# Patient Record
Sex: Male | Born: 1975 | Race: White | Hispanic: No | Marital: Married | Smoking: Never smoker
Health system: Southern US, Community
[De-identification: ages and names within clinical notes are randomized; demographics above are authoritative.]

## PROBLEM LIST (undated history)

## (undated) DIAGNOSIS — R5381 Other malaise: Secondary | ICD-10-CM

## (undated) DIAGNOSIS — R079 Chest pain, unspecified: Secondary | ICD-10-CM

## (undated) DIAGNOSIS — R5383 Other fatigue: Secondary | ICD-10-CM

## (undated) DIAGNOSIS — J189 Pneumonia, unspecified organism: Secondary | ICD-10-CM

## (undated) DIAGNOSIS — Z8249 Family history of ischemic heart disease and other diseases of the circulatory system: Secondary | ICD-10-CM

## (undated) HISTORY — DX: Other fatigue: R53.83

## (undated) HISTORY — DX: Pneumonia, unspecified organism: J18.9

## (undated) HISTORY — DX: Other malaise: R53.81

## (undated) HISTORY — DX: Chest pain, unspecified: R07.9

## (undated) HISTORY — DX: Family history of ischemic heart disease and other diseases of the circulatory system: Z82.49

---

## 2005-04-01 ENCOUNTER — Ambulatory Visit: Payer: Self-pay | Admitting: Family Medicine

## 2005-04-09 ENCOUNTER — Ambulatory Visit: Payer: Self-pay | Admitting: Family Medicine

## 2005-04-13 ENCOUNTER — Ambulatory Visit: Payer: Self-pay | Admitting: Gastroenterology

## 2005-04-17 ENCOUNTER — Ambulatory Visit: Payer: Self-pay | Admitting: Gastroenterology

## 2005-05-26 ENCOUNTER — Ambulatory Visit: Payer: Self-pay | Admitting: Gastroenterology

## 2005-07-10 LAB — HM MAMMOGRAPHY: HM Mammogram: NORMAL

## 2005-07-31 ENCOUNTER — Ambulatory Visit: Payer: Self-pay | Admitting: Family Medicine

## 2006-01-20 ENCOUNTER — Ambulatory Visit: Payer: Self-pay | Admitting: Family Medicine

## 2006-12-21 ENCOUNTER — Ambulatory Visit: Payer: Self-pay | Admitting: Family Medicine

## 2007-04-13 ENCOUNTER — Ambulatory Visit: Payer: Self-pay | Admitting: Family Medicine

## 2007-05-05 ENCOUNTER — Ambulatory Visit: Payer: Self-pay | Admitting: Family Medicine

## 2007-05-10 ENCOUNTER — Encounter: Admission: RE | Admit: 2007-05-10 | Discharge: 2007-05-10 | Payer: Self-pay | Admitting: Family Medicine

## 2007-05-13 ENCOUNTER — Telehealth (INDEPENDENT_AMBULATORY_CARE_PROVIDER_SITE_OTHER): Payer: Self-pay | Admitting: Internal Medicine

## 2007-05-17 ENCOUNTER — Ambulatory Visit: Payer: Self-pay | Admitting: Family Medicine

## 2007-06-06 ENCOUNTER — Telehealth (INDEPENDENT_AMBULATORY_CARE_PROVIDER_SITE_OTHER): Payer: Self-pay | Admitting: Internal Medicine

## 2008-03-01 ENCOUNTER — Ambulatory Visit: Payer: Self-pay | Admitting: Family Medicine

## 2008-09-04 ENCOUNTER — Encounter (INDEPENDENT_AMBULATORY_CARE_PROVIDER_SITE_OTHER): Payer: Self-pay | Admitting: Internal Medicine

## 2008-09-13 ENCOUNTER — Encounter (INDEPENDENT_AMBULATORY_CARE_PROVIDER_SITE_OTHER): Payer: Self-pay | Admitting: Internal Medicine

## 2008-11-09 ENCOUNTER — Ambulatory Visit: Payer: Self-pay | Admitting: Family Medicine

## 2008-11-09 ENCOUNTER — Encounter: Admission: RE | Admit: 2008-11-09 | Discharge: 2008-11-09 | Payer: Self-pay | Admitting: Family Medicine

## 2008-11-20 ENCOUNTER — Ambulatory Visit: Payer: Self-pay | Admitting: Family Medicine

## 2008-11-21 LAB — CONVERTED CEMR LAB
Basophils Absolute: 0 10*3/uL (ref 0.0–0.1)
Basophils Relative: 0.6 % (ref 0.0–3.0)
Lymphocytes Relative: 41.4 % (ref 12.0–46.0)
MCHC: 35.1 g/dL (ref 30.0–36.0)
MCV: 89.1 fL (ref 78.0–100.0)
Monocytes Absolute: 0.4 10*3/uL (ref 0.1–1.0)
Neutrophils Relative %: 47.5 % (ref 43.0–77.0)
Platelets: 268 10*3/uL (ref 150.0–400.0)
RBC: 4.55 M/uL (ref 4.22–5.81)
RDW: 12.1 % (ref 11.5–14.6)

## 2009-02-27 ENCOUNTER — Ambulatory Visit: Payer: Self-pay | Admitting: Family Medicine

## 2009-02-27 DIAGNOSIS — R079 Chest pain, unspecified: Secondary | ICD-10-CM | POA: Insufficient documentation

## 2009-03-12 ENCOUNTER — Encounter (INDEPENDENT_AMBULATORY_CARE_PROVIDER_SITE_OTHER): Payer: Self-pay | Admitting: *Deleted

## 2009-03-14 ENCOUNTER — Ambulatory Visit: Payer: Self-pay | Admitting: Cardiovascular Disease

## 2009-03-18 ENCOUNTER — Telehealth: Payer: Self-pay | Admitting: Family Medicine

## 2009-03-20 ENCOUNTER — Ambulatory Visit: Payer: Self-pay | Admitting: Cardiovascular Disease

## 2009-03-20 ENCOUNTER — Ambulatory Visit (HOSPITAL_COMMUNITY): Admission: RE | Admit: 2009-03-20 | Discharge: 2009-03-20 | Payer: Self-pay | Admitting: Cardiovascular Disease

## 2009-03-27 ENCOUNTER — Ambulatory Visit: Payer: Self-pay | Admitting: Family Medicine

## 2009-03-27 DIAGNOSIS — F411 Generalized anxiety disorder: Secondary | ICD-10-CM | POA: Insufficient documentation

## 2009-03-27 DIAGNOSIS — K219 Gastro-esophageal reflux disease without esophagitis: Secondary | ICD-10-CM | POA: Insufficient documentation

## 2009-04-17 ENCOUNTER — Ambulatory Visit: Payer: Self-pay | Admitting: Family Medicine

## 2009-07-30 ENCOUNTER — Telehealth: Payer: Self-pay | Admitting: Family Medicine

## 2010-03-09 LAB — CONVERTED CEMR LAB
AST: 25 units/L (ref 0–37)
Albumin: 4.7 g/dL (ref 3.5–5.2)
Bilirubin, Direct: 0.1 mg/dL (ref 0.0–0.3)
CO2: 30 meq/L (ref 19–32)
Calcium: 9.4 mg/dL (ref 8.4–10.5)
Chloride: 106 meq/L (ref 96–112)
Cholesterol: 159 mg/dL (ref 0–200)
Eosinophils Absolute: 0 10*3/uL (ref 0.0–0.7)
GFR calc non Af Amer: 102.29 mL/min (ref >60)
Glucose, Bld: 95 mg/dL (ref 70–99)
HCT: 43.3 % (ref 39.0–52.0)
HDL: 28.7 mg/dL — ABNORMAL LOW (ref >39.00)
Hemoglobin: 14.6 g/dL (ref 13.0–17.0)
Lymphs Abs: 1.5 10*3/uL (ref 0.7–4.0)
MCV: 90.8 fL (ref 78.0–100.0)
Monocytes Absolute: 0.3 10*3/uL (ref 0.1–1.0)
Monocytes Relative: 7.1 % (ref 3.0–12.0)
Neutrophils Relative %: 56.7 % (ref 43.0–77.0)
TSH: 2.53 microintl units/mL (ref 0.35–5.50)
Total Bilirubin: 1.7 mg/dL — ABNORMAL HIGH (ref 0.3–1.2)
Total CHOL/HDL Ratio: 6
Triglycerides: 111 mg/dL (ref 0.0–149.0)
VLDL: 22.2 mg/dL (ref 0.0–40.0)
WBC: 4.2 10*3/uL — ABNORMAL LOW (ref 4.5–10.5)

## 2010-03-11 NOTE — Progress Notes (Signed)
Summary: pt requests phone call  Phone Note Call from Patient Call back at Work Phone [phone] Call back at   cell [phone].   Caller: Patient Call For: Judith Part MD Summary of Call: Pt will be going for a cardiac CT scan on wednesday.  He has some questions and concerns regarding this and he is asking that you call him tomorrow to discuss.  He is considering cancelling this exam. Initial call taken by: Lowella Petties CMA,  March 18, 2009 4:53 PM  Follow-up for Phone Call        he had questions about the amount of radiation that would be involved in cardiac CT and risks vs benefits  I adv him to call the cardiac office to better answer that question  Follow-up by: Judith Part MD,  March 19, 2009 2:01 PM

## 2010-03-11 NOTE — Assessment & Plan Note (Signed)
Summary: HEARTBURN.. CYD   Vital Signs:  Patient profile:   35 year old male Height:      69.25 inches Weight:      182.75 pounds BMI:     26.89 Temp:     97.8 degrees F oral Pulse rate:   80 / minute Pulse rhythm:   regular BP sitting:   124 / 84  (left arm) Cuff size:   large  Vitals Entered By: Lewanda Rife LPN (March 27, 2009 12:29 PM)  History of Present Illness: had a cardiac CT - to assess risks  was really worried about it  day of the test trouble getting IV acesss - that was scary    after the test was over had a flushing rxn - and some hives -- per haps all rxn  given px for prednisone -- did not need took some benadryl   now is having a burning sensation - esp in bed at night  very hollow weird feeling -- wondered if it was anxiety -- almost in tears with it  worried that the test "damaged him" still nagging heartburn on friday  very strange feeling  called the radiology dept -- ? still having rxn -- she assured him that it was not  by sunday - his anx started calming down   monday started some mid back pain   took some gaviscon at work - and that helped a bit  went home that night  got worse lately -- and took some apple cider vinigar - that made his stomach hurt a bit  took  2 tablespoons of honey- that helped  ate it all day yesteday - and it did help  symptoms are coming and going     Allergies (verified): No Known Drug Allergies  Past History:  Past Medical History: Last updated: 03/13/2009 Current Problems:  CORONARY ARTERY DISEASE, FAMILY HX (ICD-V17.3) CHEST PAIN (ICD-786.50) FATIGUE (ICD-780.79) HEALTH MAINTENANCE EXAM (ICD-V70.0) pneumonia  Past Surgical History: Last updated: 04/28/2007 colonoscopy 3/07 hemorrhoids  Family History: Last updated: 03/21/2009 Father: died at 33 of esophageal ca--smoker, cardiomyopathy, ruptured cerebral aneurism Mother:  Siblings:  PGF - heart problems -- died at 98 Paunt died at 41  heart Paunt died at 23 with heart  GM with diabetes  brothers times 2 with HTN in 66s   DM- MI- CVA-  Prostate Cancer- Breast Cancer- Ovarian Cancer- Uterine Cancer- Colon Cancer- Drug/ ETOH Abuse- Depression-   Social History: Last updated: Mar 21, 2009 non smoker- never  regular exercise  occasional alcohol   Review of Systems General:  Denies fatigue, fever, loss of appetite, and malaise. Eyes:  Denies blurring and discharge. ENT:  Denies sore throat. CV:  Denies chest pain or discomfort, leg cramps with exertion, lightheadness, near fainting, palpitations, and shortness of breath with exertion. Resp:  Denies cough, pleuritic, shortness of breath, and wheezing. GI:  Complains of gas and indigestion; denies abdominal pain, dark tarry stools, diarrhea, nausea, and vomiting. Derm:  Denies itching, lesion(s), poor wound healing, and rash. Neuro:  Denies numbness and tingling. Psych:  Complains of anxiety; denies panic attacks, sense of great danger, and suicidal thoughts/plans. Endo:  Denies cold intolerance, excessive thirst, excessive urination, and heat intolerance. Heme:  Denies abnormal bruising and bleeding.  Physical Exam  General:  Well-developed,well-nourished,in no acute distress; alert,appropriate and cooperative throughout examination Head:  normocephalic, atraumatic, and no abnormalities observed.   Eyes:  vision grossly intact, pupils equal, pupils round, and pupils reactive to light.  no conjunctival  pallor, injection or icterus  Ears:  R ear normal and L ear normal.   Mouth:  pharynx pink and moist.   Neck:  supple with full rom and no masses or thyromegally, no JVD or carotid bruit  Chest Wall:  No deformities, masses, tenderness or gynecomastia noted. Lungs:  Normal respiratory effort, chest expands symmetrically. Lungs are clear to auscultation, no crackles or wheezes. Heart:  Normal rate and regular rhythm. S1 and S2 normal without gallop, murmur, click,  rub or other extra sounds. Abdomen:  mld epigastric tenderness without rebound or gaurding  soft, normal bowel sounds, no distention, no masses, no hepatomegaly, and no splenomegaly.   Msk:  No deformity or scoliosis noted of thoracic or lumbar spine.   no CVA tenderness  Pulses:  R and L carotid,radial,femoral,dorsalis pedis and posterior tibial pulses are full and equal bilaterally Extremities:  No clubbing, cyanosis, edema, or deformity noted with normal full range of motion of all joints.   Neurologic:  sensation intact to light touch, gait normal, and DTRs symmetrical and normal.   Skin:  Intact without suspicious lesions or rashes Cervical Nodes:  No lymphadenopathy noted Inguinal Nodes:  No significant adenopathy Psych:  somewhat anxious with rapid speech- but pleasant with good eye contact and communication skills   Impression & Recommendations:  Problem # 1:  HEARTBURN (ICD-787.1) Assessment New worse with anxiety revolving around recent cardiac testing  some imp with gaviscon and honey trial of prilosec 20 /also bland diet and gradual dec in caffiene  if not significant imp in 10 days -- will get abd [place]  f/u 1 mo for r e check and lab (hx of baseline high bilirubin as well  Problem # 2:  CORONARY ARTERY DISEASE, FAMILY HX (ICD-V17.3) Assessment: Unchanged re assured with recent nl cardiac CT- reviewed this today  Problem # 3:  ANXIETY STATE, UNSPECIFIED (ICD-300.00) Assessment: New triggered by recent stressful event and cardiac testing  suspec this worsened gastritis and reflux  is now calmed down- but can consider counseling if not imp had good support and coping skills   Complete Medication List: 1)  Multivitamins Tabs (Multiple vitamin) .... Take 1 tablet by mouth once a day 2)  Vitamin C 500 Mg Tabs (Ascorbic acid) .... Take one by mouth daily 3)  Flonase 50 Mcg/act Susp (Fluticasone propionate) .... 2 sprays in each nostril once daily 4)  Fish Oil 1000 Mg Caps  (Omega-3 fatty acids) .... Take 1 capsule by mouth two times a day 5)  Prilosec 20 Mg Cpdr (Omeprazole) .Marland Kitchen.. 1 by mouth once daily in am  Patient Instructions: 1)  gradually cut caffiene by one serving per week ( or 1/3 per week) - consider a low acid brand or decaff  2)  avoid spicy food  3)  try to eat regular meals 4)  start omeprazole one pill daily about 30 minutes before breakfast  5)  if your symptoms are not starting to improve in 10 days (or if worse) please call and I will set up ultrasound  6)  follow up with me in 1 month  Prescriptions: PRILOSEC 20 MG CPDR (OMEPRAZOLE) 1 by mouth once daily in am  #30 x 5   Entered and Authorized by:   Judith Part MD   Signed by:   Judith Part MD on 03/27/2009   Method used:   Electronically to        [id number]  [address]       [place], [place]  [address]       Ph: [phone]       Fax: [phone]   RxID:   [id number]   Current Allergies (reviewed today): No known allergies

## 2010-03-11 NOTE — Progress Notes (Signed)
Summary: prior Berkley Harvey is needed for omeprazole  Phone Note Call from Patient Call back at [phone]   Caller: Patient Call For: Judith Part MD Summary of Call: Prior auth is needed for omeprazole.  I spoke with the  patient and he has not tried any other reflux meds. Pharmacist said pt can get prilosec otc, 20 mg's.  A months supply will cost him $5.00.  Ok to change to that? Initial call taken by: Lowella Petties CMA,  July 30, 2009 2:28 PM  Follow-up for Phone Call        Fine. Follow-up by: Shaune Leeks MD,  July 30, 2009 2:41 PM  Additional Follow-up for Phone Call Additional follow up Details #1::        Changed in emr, called to pharmacy. Advised pt. Additional Follow-up by: Lowella Petties CMA,  July 31, 2009 11:54 AM    New/Updated Medications: PRILOSEC OTC 20 MG TBEC (OMEPRAZOLE MAGNESIUM) take one by mouth every morning Prescriptions: PRILOSEC OTC 20 MG TBEC (OMEPRAZOLE MAGNESIUM) take one by mouth every morning  #30 x 5   Entered by:   Lowella Petties CMA   Authorized by:   Shaune Leeks MD   Signed by:   Lowella Petties CMA on 07/30/2009   Method used:   Telephoned to ...       [facility]  [address] [id number]       [address]       [place], [place]  [address]       Ph: [phone]       Fax: [phone]   RxID:   [id number]   Prior Medications: MULTIVITAMINS   TABS (MULTIPLE VITAMIN) Take 1 tablet by mouth once a day VITAMIN C 500 MG TABS (ASCORBIC ACID) Take one by mouth daily FISH OIL 1000 MG CAPS (OMEGA-3 FATTY ACIDS) Take 1 capsule by mouth two times a day Current Allergies: No known allergies

## 2010-03-11 NOTE — Assessment & Plan Note (Signed)
Summary: np6/CAD/family hx of CAD   Visit Type:  Initial Consult  CC:  Chest discomfort- Left shoulder pain.  History of Present Illness: Darryl Jenkins is seen today at the request of Dr Milinda Antis.  He has a markedly positive family history for CAD  with everyone except his mother in the last 3 generations having premature CAD or DCM.  He has had 2 recentl episodes of SSCP radiating to the left shoulder.  the first one lasted about an hour and and subsided spontaneously.  He couldnt find a comfortalbe position  The second episode was more central and lasted shorter time period.  He has had occasional SOB wtih exertion whe he plays softball.  I believe his LDL choesterol is ok but he has not had a lipomed profile.  Given his symptoms and markedly positive family history he was referred for evaluation.  He takes fish oil but not ASA  Current Problems (verified): 1)  Coronary Artery Disease, Family Hx  (ICD-V17.3) 2)  Chest Pain  (ICD-786.50) 3)  Fatigue  (ICD-780.79) 4)  Health Maintenance Exam  (ICD-V70.0)  Current Medications (verified): 1)  Multivitamins   Tabs (Multiple Vitamin) .... Take 1 Tablet By Mouth Once A Day 2)  Vitamin C 500 Mg Tabs (Ascorbic Acid) .... Take One By Mouth Daily 3)  Flonase 50 Mcg/act Susp (Fluticasone Propionate) .... 2 Sprays in Each Nostril Once Daily 4)  Fish Oil 1000 Mg Caps (Omega-3 Fatty Acids) .... Take 1 Capsule By Mouth Two Times A Day  Allergies (verified): No Known Drug Allergies  Past History:  Past Medical History: Last updated: 03/13/2009 Current Problems:  CORONARY ARTERY DISEASE, FAMILY HX (ICD-V17.3) CHEST PAIN (ICD-786.50) FATIGUE (ICD-780.79) HEALTH MAINTENANCE EXAM (ICD-V70.0) pneumonia  Past Surgical History: Last updated: 04/28/2007 colonoscopy 3/07 hemorrhoids  Family History: Last updated: 2009/03/10 Father: died at 65 of esophageal ca--smoker, cardiomyopathy, ruptured cerebral aneurism Mother:  Siblings:  PGF - heart problems --  died at 40 Paunt died at 20 heart Paunt died at 10 with heart  GM with diabetes  brothers times 2 with HTN in 60s   DM- MI- CVA-  Prostate Cancer- Breast Cancer- Ovarian Cancer- Uterine Cancer- Colon Cancer- Drug/ ETOH Abuse- Depression-   Social History: Last updated: 03/10/09 non smoker- never  regular exercise  occasional alcohol   Review of Systems       Denies fever, malais, weight loss, blurry vision, decreased visual acuity, cough, sputum,  hemoptysis, pleuritic pain, palpitaitons, heartburn, abdominal pain, melena, lower extremity edema, claudication, or rash. All other systmptom reviewed and negative  Vital Signs:  Patient profile:   35 year old male Height:      69.25 inches Weight:      187.50 pounds BMI:     27.59 Pulse rate:   82 / minute Resp:     18 per minute BP sitting:   136 / 80  (left arm) Cuff size:   large  Vitals Entered By: Vikki Ports (March 14, 2009 3:42 PM)  Physical Exam  General:  Affect appropriate Healthy:  appears stated age HEENT: normal Neck supple with no adenopathy JVP normal no bruits no thyromegaly Lungs clear with no wheezing and good diaphragmatic motion Heart:  S1/S2 no murmur,rub, gallop or click PMI normal Abdomen: benighn, BS positve, no tenderness, no AAA no bruit.  No HSM or HJR Distal pulses intact with no bruits No edema Neuro non-focal Skin warm and dry    Impression & Recommendations:  Problem # 1:  CHEST  PAIN (ICD-786.50) The best single test for this paitent that would have both short and long term prognostic value, R/O congenital anomalies, and HOCM, assess coronary arteries and any beginnings of CAD would be a cardiac CT with calcium score.  We will see if his insurance will pay for this.  If they don't we would be left with a ETT treadmill and Echo.  This would not address his 5-10 year risk given positive family history.  I would also recommend a lipomed profile to further stratify his  particle size and need for risk factor modification Orders: EKG w/ Interpretation (93000)  Problem # 2:  DYSPNEA (ICD-786.05) Normal cardiopulmonary exam.  CT would assess RV/LV function and R/O HOCM as well as evaluated remainder of chest Alternatively echo to be done  Other Orders: Cardiac CTA (Cardiac CTA)  Patient Instructions: 1)  Your physician recommends that you schedule a follow-up appointment as needed 2)  Your physician has requested that you have a cardiac CT.  Cardiac computed tomography (CT) is a painless test that uses an x-ray machine to take clear, detailed pictures of your heart.  For further information please visit [email or web address]  Please follow instruction sheet as given.   EKG Report  Procedure date:  03/14/2009  Findings:      Sinus arrythmia Rate 82 otherwise normal ECG

## 2010-03-11 NOTE — Assessment & Plan Note (Signed)
Summary: CPX/CLE   Vital Signs:  Patient profile:   35 year old male Height:      69.25 inches Weight:      186.25 pounds BMI:     27.40 Temp:     98 degrees F oral Pulse rate:   64 / minute Pulse rhythm:   regular BP sitting:   124 / 82  (left arm) Cuff size:   large  Vitals Entered By: Lewanda Rife LPN (April 17, 1608 10:39 AM)  History of Present Illness: here for wellness exam and chronic med problems   wt is up 4 lb with bmi of 27-- is eating better   bp good at 124/82  recent nl cardiac w/u  due for Td 2000 due for ptx 04 -- has had pneumonia in past   labs in jan - bili 1.7 LDL 108 and HDL low in 20s -- is taking 1000 mg of fish oil tried 2 at a time for 2 weeks -- then two times a day --- then got out of habit  exercise - plays rec softball spring and fall  issues with time   gerd is better -- with omeprazole   no prostate problems / does have occ hemorroid that itches     Allergies (verified): No Known Drug Allergies  Past History:  Past Medical History: Last updated: 03/13/2009 Current Problems:  CORONARY ARTERY DISEASE, FAMILY HX (ICD-V17.3) CHEST PAIN (ICD-786.50) FATIGUE (ICD-780.79) HEALTH MAINTENANCE EXAM (ICD-V70.0) pneumonia  Past Surgical History: Last updated: 04/28/2007 colonoscopy 3/07 hemorrhoids  Family History: Last updated: 25-Mar-2009 Father: died at 36 of esophageal ca--smoker, cardiomyopathy, ruptured cerebral aneurism Mother:  Siblings:  PGF - heart problems -- died at 98 Paunt died at 36 heart Paunt died at 63 with heart  GM with diabetes  brothers times 2 with HTN in 67s   DM- MI- CVA-  Prostate Cancer- Breast Cancer- Ovarian Cancer- Uterine Cancer- Colon Cancer- Drug/ ETOH Abuse- Depression-   Social History: Last updated: Mar 25, 2009 non smoker- never  regular exercise  occasional alcohol   Review of Systems General:  Denies fatigue, fever, loss of appetite, and malaise. Eyes:  Denies blurring and  eye irritation. ENT:  Denies sore throat. CV:  Denies chest pain or discomfort, palpitations, and shortness of breath with exertion. Resp:  Denies cough and wheezing. GI:  Denies abdominal pain, bloody stools, change in bowel habits, and indigestion. GU:  Denies nocturia, urinary frequency, and urinary hesitancy. MS:  Denies joint pain, joint redness, joint swelling, and muscle aches. Derm:  Denies poor wound healing and rash. Neuro:  Denies headaches, numbness, and tingling. Psych:  Denies anxiety and depression. Endo:  Denies excessive thirst and excessive urination. Heme:  Denies abnormal bruising, bleeding, and enlarge lymph nodes.   Physical Exam  General:  Well-developed,well-nourished,in no acute distress; alert,appropriate and cooperative throughout examination Head:  normocephalic, atraumatic, and no abnormalities observed.   Eyes:  vision grossly intact, pupils equal, pupils round, and pupils reactive to light.  no conjunctival pallor, injection or icterus  Ears:  R ear normal and L ear normal.   Nose:  no nasal discharge.   Mouth:  pharynx pink and moist.   Neck:  supple with full rom and no masses or thyromegally, no JVD or carotid bruit  Chest Wall:  No deformities, masses, tenderness or gynecomastia noted. Lungs:  Normal respiratory effort, chest expands symmetrically. Lungs are clear to auscultation, no crackles or wheezes. Heart:  Normal rate and regular rhythm. S1 and  S2 normal without gallop, murmur, click, rub or other extra sounds. Abdomen:  Bowel sounds positive,abdomen soft and non-tender without masses, organomegaly or hernias noted. no renal bruits  Msk:  No deformity or scoliosis noted of thoracic or lumbar spine.  no acute joint changes  Pulses:  R and L carotid,radial,femoral,dorsalis pedis and posterior tibial pulses are full and equal bilaterally Extremities:  No clubbing, cyanosis, edema, or deformity noted with normal full range of motion of all joints.     Neurologic:  sensation intact to light touch, gait normal, and DTRs symmetrical and normal.   Skin:  Intact without suspicious lesions or rashes comedone on low back few lentigos Cervical Nodes:  No lymphadenopathy noted Inguinal Nodes:  No significant adenopathy Psych:  normal affect, talkative and pleasant - is not anxious today   Impression & Recommendations:  Problem # 1:  HEALTH MAINTENANCE EXAM (ICD-V70.0) Assessment Comment Only reviewed health habits including diet, exercise and skin cancer prevention reviewed health maintenance list and family history overall quite healthy disc getting back to exercise reviewed lab today suspect elevated bili may be baselne/ gilberts-- will continue to monitor pt given copy of labs   Problem # 2:  HEARTBURN (ICD-787.1) Assessment: Improved much imp with omeprazole  will continue this 1 more mo and exp imp with less stress update if symptoms return  Problem # 3:  CHEST PAIN (ICD-786.50) Assessment: Improved this is resolved rev neg cardiac work up  adv to update me if symptoms return  Complete Medication List: 1)  Multivitamins Tabs (Multiple vitamin) .... Take 1 tablet by mouth once a day 2)  Vitamin C 500 Mg Tabs (Ascorbic acid) .... Take one by mouth daily 3)  Fish Oil 1000 Mg Caps (Omega-3 fatty acids) .... Take 1 capsule by mouth two times a day 4)  Prilosec 20 Mg Cpdr (Omeprazole) .Marland Kitchen.. 1 by mouth once daily in am  Other Orders: TD Toxoids IM 7 YR + (56213) Pneumococcal Vaccine (08657) Admin 1st Vaccine (84696) Admin of Any Addtl Vaccine (29528) Admin 1st Vaccine (State) [phone]) Admin of Any Addtl Vaccine (State) [phone])  Patient Instructions: 1)  continue the omeprazole daily for 1 month and then try to stop it  2)  if symptoms return- re start it and let me know  3)  try to go up to 3 fish oil caps per day - if you do not tolerate it , drop back  4)  tetnus shot and pneumonia vaccine  5)  we will watch the  bilirubin level - I suspect this is hereditary -- if you get right sided abdominal pain please update me   Current Allergies (reviewed today): No known allergies      Tetanus/Td Vaccine    Vaccine Type: Td    Site: left deltoid    Mfr: Sanofi Pasteur    Dose: 0.5 ml    Route: IM    Given by: Lewanda Rife LPN    Exp. Date: 12/25/2010    Lot #: [id number]    VIS given: 12/28/06 version given April 17, 2009.  Pneumovax Vaccine    Vaccine Type: Pneumovax    Site: right deltoid    Mfr: Merck    Dose: 0.5 ml    Route: IM    Given by: Lewanda Rife LPN    Exp. Date: 09/23/2010    Lot #: [id number]    VIS given: 09/07/95 version given April 17, 2009.

## 2010-03-11 NOTE — Assessment & Plan Note (Signed)
Summary: SHOULDER PAIN,CHECK BP/CLE   Vital Signs:  Patient profile:   35 year old male Weight:      188 pounds Temp:     97.9 degrees F oral Pulse rate:   68 / minute Pulse rhythm:   regular BP sitting:   132 / 90  (left arm) Cuff size:   regular  Vitals Entered By: Lowella Petties CMA (February 27, 2009 12:23 PM)  Serial Vital Signs/Assessments:  Time      Position  BP       Pulse  Resp  Temp     By                     120/82                         Judith Part MD  CC: Check BP, discuss concerns   History of Present Illness: he is concerned about his heart health has a fam hx of heart problems  over past 6 mo has noticed some changes  wt gain in past 4-5 mo -- highest wt ever (no change in eating or exercise habits)  decided to start running with his new dog  after 1/4 mile gets discomfort in chest- right in center / very hard to describe (stops after he stops running )  also a little more sob than he thinks he should be no nausea or diaphoresis  monday - had some shoulder pain L in afternoon- desc as burning pain  / so he put heating pad on it / dozed off and was fine until that eve when he was resting again (it did hurt to move it )  yesterday was just fine   friend of his recently had MI- and this concerns him   plays rec softball and eats very healthy   some stress- school and job and 2 kids --  a lot o handle     Allergies: No Known Drug Allergies  Family History: Father: died at 71 of esophageal ca--smoker, cardiomyopathy, ruptured cerebral aneurism Mother:  Siblings:  PGF - heart problems -- died at 75 Paunt died at 67 heart Paunt died at 41 with heart  GM with diabetes  brothers times 2 with HTN in 46s   DM- MI- CVA-  Prostate Cancer- Breast Cancer- Ovarian Cancer- Uterine Cancer- Colon Cancer- Drug/ ETOH Abuse- Depression-   Social History: non smoker- never  regular exercise  occasional alcohol   Review of Systems General:   Complains of fatigue; denies chills, fever, loss of appetite, and malaise. Eyes:  Denies blurring and eye pain. CV:  Complains of chest pain or discomfort, shortness of breath with exertion, and weight gain; denies difficulty breathing at night, difficulty breathing while lying down, fainting, lightheadness, near fainting, palpitations, and swelling of feet. Resp:  Denies cough, pleuritic, sputum productive, and wheezing. GI:  Denies abdominal pain, change in bowel habits, indigestion, nausea, and vomiting. GU:  Denies urinary frequency. MS:  Denies joint redness, joint swelling, cramps, and muscle weakness. Derm:  Denies itching, lesion(s), poor wound healing, and rash. Neuro:  Denies numbness, tingling, and weakness. Psych:  mood is good . Endo:  Denies excessive thirst and excessive urination. Heme:  Denies abnormal bruising and bleeding.  Physical Exam  General:  Well-developed,well-nourished,in no acute distress; alert,appropriate and cooperative throughout examination Head:  normocephalic, atraumatic, and no abnormalities observed.   Eyes:  vision grossly intact, pupils equal, pupils round,  and pupils reactive to light.  no conjunctival pallor, injection or icterus  Mouth:  pharynx pink and moist.   Neck:  supple with full rom and no masses or thyromegally, no JVD or carotid bruit  no cervical tenderness  Chest Wall:  No deformities, masses, tenderness or gynecomastia noted. Lungs:  Normal respiratory effort, chest expands symmetrically. Lungs are clear to auscultation, no crackles or wheezes. Heart:  Normal rate and regular rhythm. S1 and S2 normal without gallop, murmur, click, rub or other extra sounds. Abdomen:  Bowel sounds positive,abdomen soft and non-tender without masses, organomegaly or hernias noted. no renal bruits  Msk:  L shoulder - no tenderness/swelling or deformity nl rom  neg hawking/neer tests  nl scratch test  no chest wall tenderness  Pulses:  R and L  carotid,radial,femoral,dorsalis pedis and posterior tibial pulses are full and equal bilaterally Extremities:  No clubbing, cyanosis, edema, or deformity noted with normal full range of motion of all joints.   Neurologic:  strength normal in all extremities and sensation intact to light touch.   Skin:  Intact without suspicious lesions or rashes Cervical Nodes:  No lymphadenopathy noted Psych:  normal affect, talkative and pleasant    Impression & Recommendations:  Problem # 1:  CHEST PAIN (ICD-786.50) Assessment New healthy pt (fam hx heart dz) with new exertional chest and shoulder discomfort nl ekg and labs pending  in light of strong fam hx (and desire to be more active) will ref to cardiol to disc risk factors and poss stress testing  adv if symptoms return or worsen - to seek care immed (pt voiced understanding )  Orders: EKG w/ Interpretation (93000) Venipuncture (52841) TLB-Lipid Panel (80061-LIPID) TLB-BMP (Basic Metabolic Panel-BMET) (80048-METABOL) TLB-CBC Platelet - w/Differential (85025-CBCD) TLB-Hepatic/Liver Function Pnl (80076-HEPATIC) TLB-TSH (Thyroid Stimulating Hormone) (84443-TSH) Cardiology Referral (Cardiology)  Problem # 2:  CORONARY ARTERY DISEASE, FAMILY HX (ICD-V17.3) Assessment: Comment Only see above  disc risk factors  card ref  lab today- todisc at f/u incl chol Orders: EKG w/ Interpretation (93000) Cardiology Referral (Cardiology)  Problem # 3:  FATIGUE (ICD-780.79) Assessment: New labs today may be from schedule stress to some extent  will disc in more detail at f/u Orders: Venipuncture (32440) TLB-Lipid Panel (80061-LIPID) TLB-BMP (Basic Metabolic Panel-BMET) (80048-METABOL) TLB-CBC Platelet - w/Differential (85025-CBCD) TLB-Hepatic/Liver Function Pnl (80076-HEPATIC) TLB-TSH (Thyroid Stimulating Hormone) (84443-TSH)  Complete Medication List: 1)  Multivitamins Tabs (Multiple vitamin) .... Take 1 tablet by mouth once a day 2)  Vitamin  C 500 Mg Tabs (Ascorbic acid) .... Take one by mouth daily 3)  Flonase 50 Mcg/act Susp (Fluticasone propionate) .... 2 sprays in each nostril once daily 4)  Proventil Hfa 108 (90 Base) Mcg/act Aers (Albuterol sulfate) .... 2 puffs up to every 4 hours as needed wheezing or chest tightness  Patient Instructions: 1)  labs today including cholesterol 2)  we will schedule cardiology consult at check out  3)  try to keep up healthy low fat and low salt diet    Orders Added: 1)  EKG w/ Interpretation [93000] 2)  Venipuncture [36415] 3)  TLB-Lipid Panel [80061-LIPID] 4)  TLB-BMP (Basic Metabolic Panel-BMET) [80048-METABOL] 5)  TLB-CBC Platelet - w/Differential [85025-CBCD] 6)  TLB-Hepatic/Liver Function Pnl [80076-HEPATIC] 7)  TLB-TSH (Thyroid Stimulating Hormone) [84443-TSH] 8)  Cardiology Referral [Cardiology] 9)  Est. Patient Level IV [10272]   Prior Medications (reviewed today): MULTIVITAMINS   TABS (MULTIPLE VITAMIN) Take 1 tablet by mouth once a day VITAMIN C 500 MG TABS (ASCORBIC ACID) Take  one by mouth daily FLONASE 50 MCG/ACT SUSP (FLUTICASONE PROPIONATE) 2 sprays in each nostril once daily PROVENTIL HFA 108 (90 BASE) MCG/ACT AERS (ALBUTEROL SULFATE) 2 puffs up to every 4 hours as needed wheezing or chest tightness Current Allergies: No known allergies    EKG  Procedure date:  02/27/2009  Findings:      NSR with no acute changes rate of 63

## 2010-03-11 NOTE — Letter (Signed)
Summary: Email containing Family History  Email containing Family History   Imported By: Marylou Mccoy 03/14/2009 16:58:25  _____________________________________________________________________  External Attachment:    Type:   Image     Comment:   External Document

## 2010-04-30 IMAGING — CT CT HEART W/O CORONARY CALCIUM
1 of 5 series · 14 of 20 positions shown, 18 images · IV contrast (APPLIED)
Comparison: none

03/21/2009 – This exam was incorrectly ordered and should be CT HEART MORPH W/CTA W/SCORE.  Charges to the patient’s account have been adjusted and no changes were made to the original report.
 Cardiac CTA:
INDICATION: Chest Pain, Dyspnea, positive family history of CAD
PROTOCOL: The patient was scanned on a Siemens Sensation 64 slice
 scanner. Collimation was [DATE]mm. Reconstruction overlap was
 .4mm. To reduce radiation dose the patient was images at 3TTAv,
 with dose and topographic modulation. The paitient also had on
 protective lead below the waist, a bismuth shield and leaded
 glasses. He received 15mg of iv lopresser and sublingual nitro.
 Average heart rate during the scan was 72 bpm. After an initial AP
 and lateral topogram, calcium scoring was done axially with 3mm non-
 contrast slices. 20cc of contrast was given with Jolita Ir Rimas Rapalyte in the
 ascending aorta. Coronary CTA was done using 80cc of contrast and
 a delay of 18 seconds. The 3D data set was sent to [facility]
 work-station for reconstruction using MIP, VRT and MPR images.

[Series 9: multiphase 0-90 · axial · 0.45mm/px · z∈[-269,-124]mm · 14 of 4170 slices shown, 18 images]
[im 278/4170  vessel]
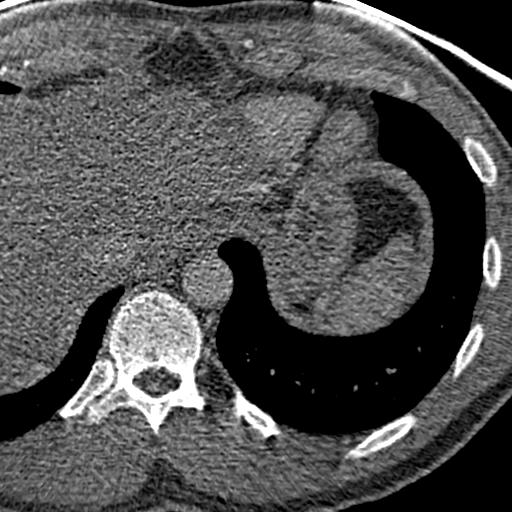
[im 278/4170  lung]
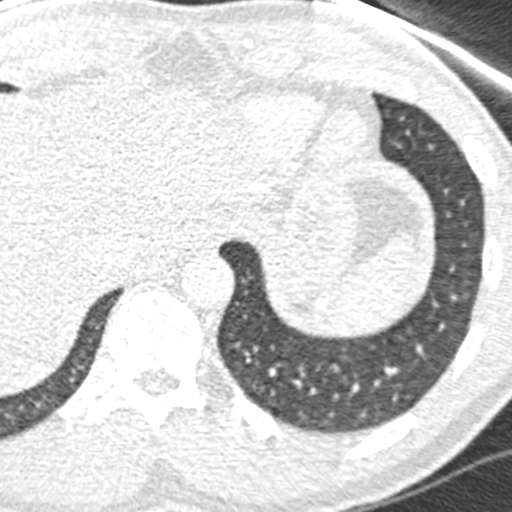
[im 556/4170  vessel]
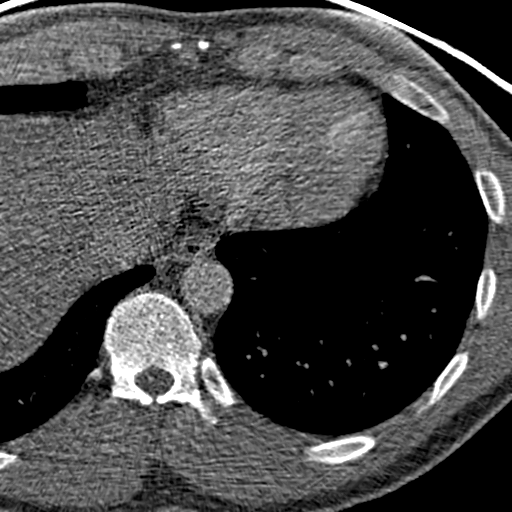
[im 834/4170  vessel]
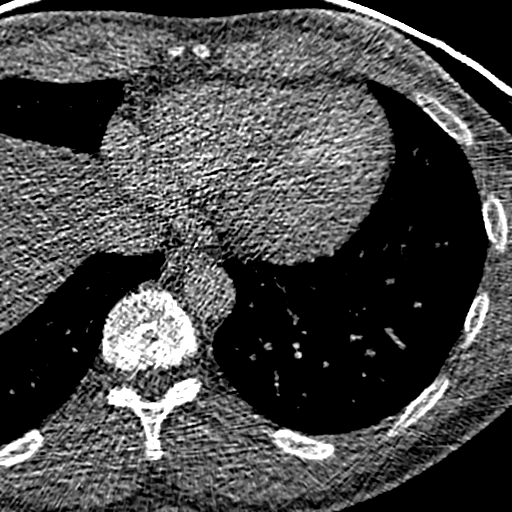
[im 1112/4170  vessel]
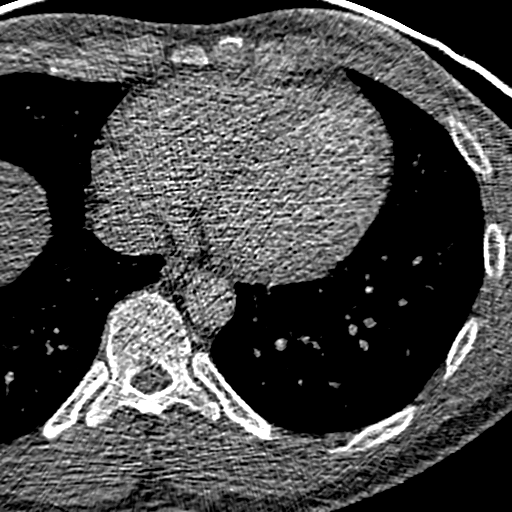
[im 1390/4170  vessel]
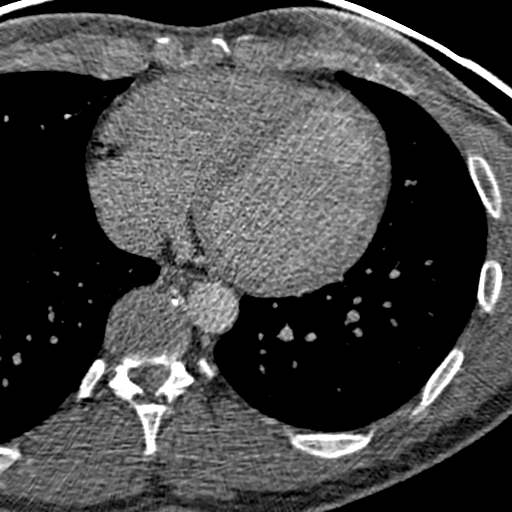
[im 1390/4170  lung]
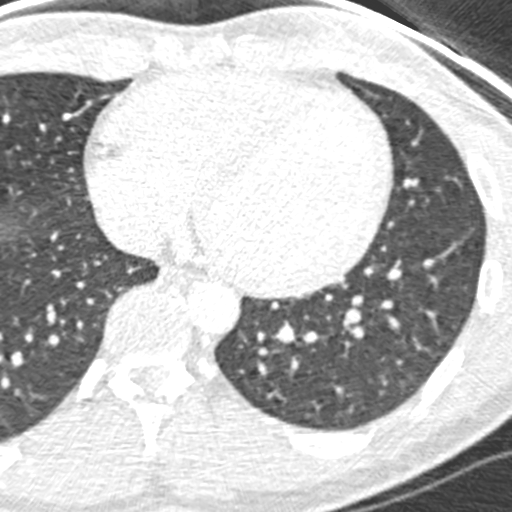
[im 1668/4170  vessel]
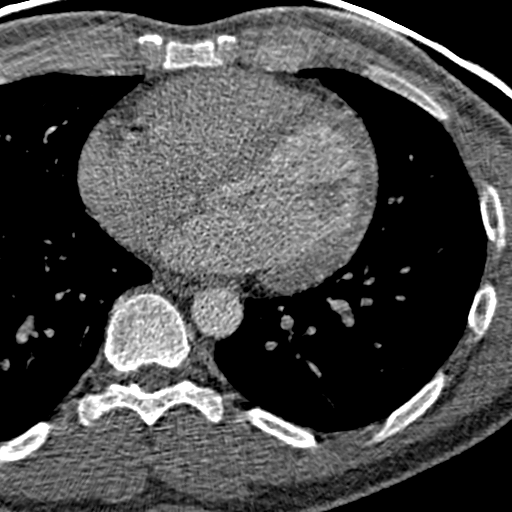
[im 1946/4170  vessel]
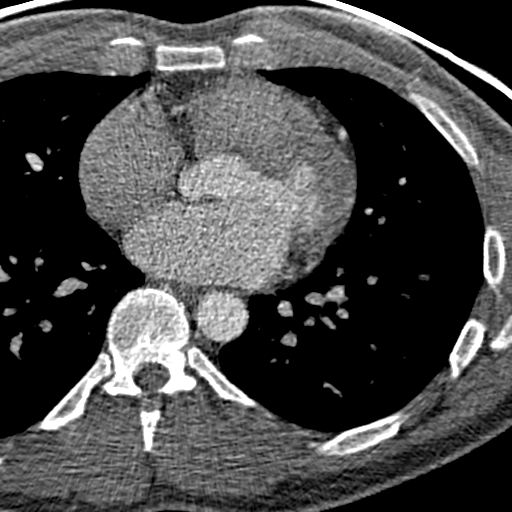
[im 2224/4170  vessel]
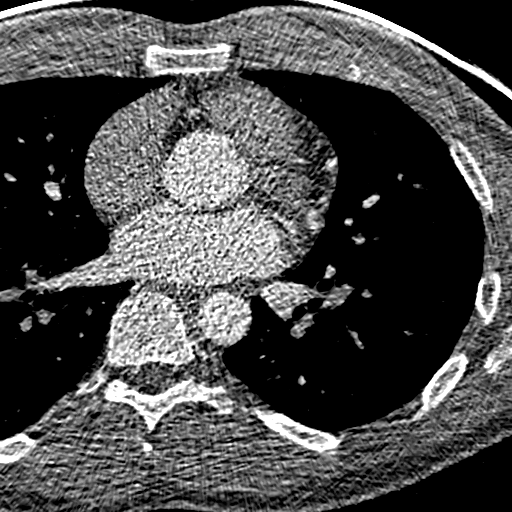
[im 2502/4170  vessel]
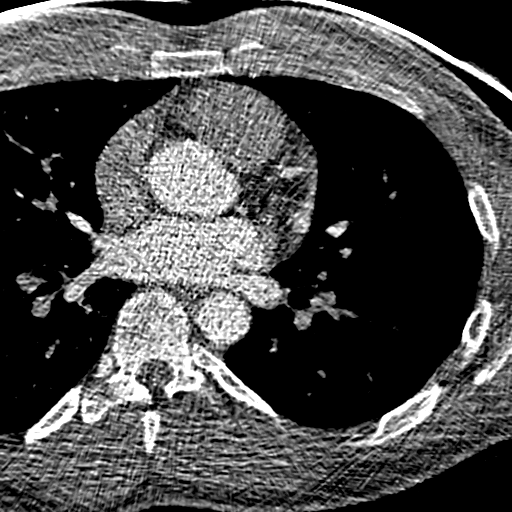
[im 2502/4170  lung]
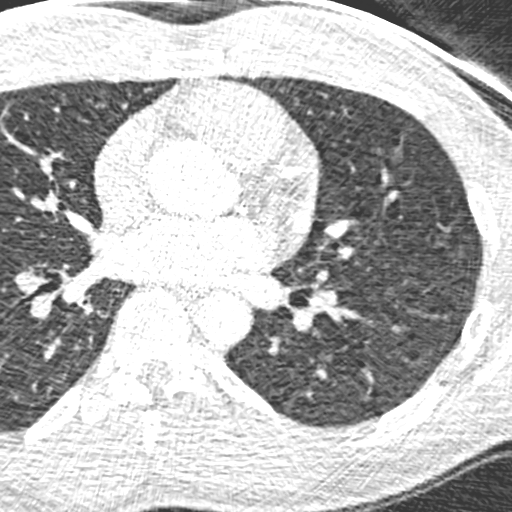
[im 2780/4170  vessel]
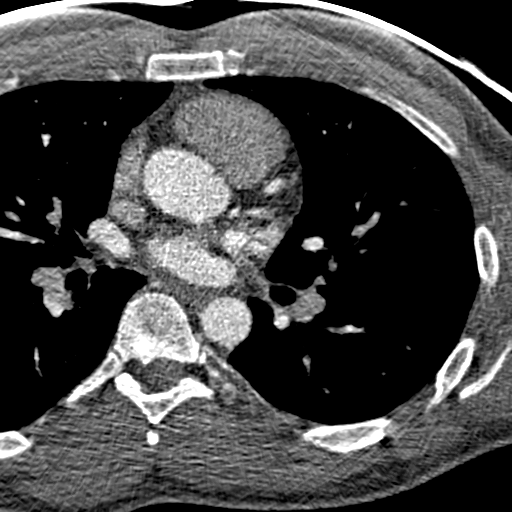
[im 3058/4170  vessel]
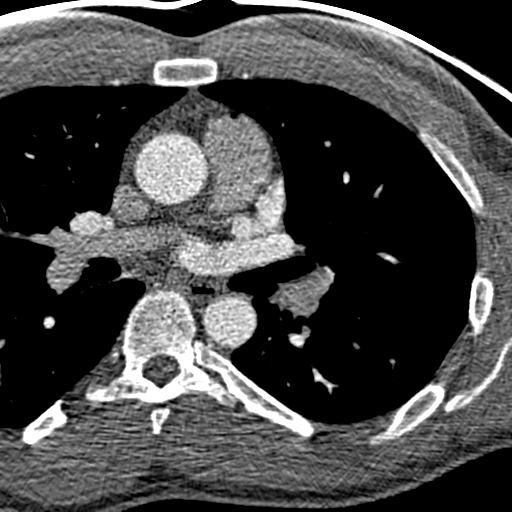
[im 3336/4170  vessel]
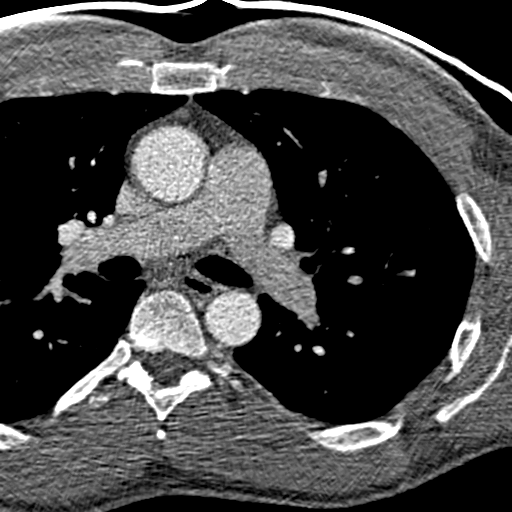
[im 3614/4170  vessel]
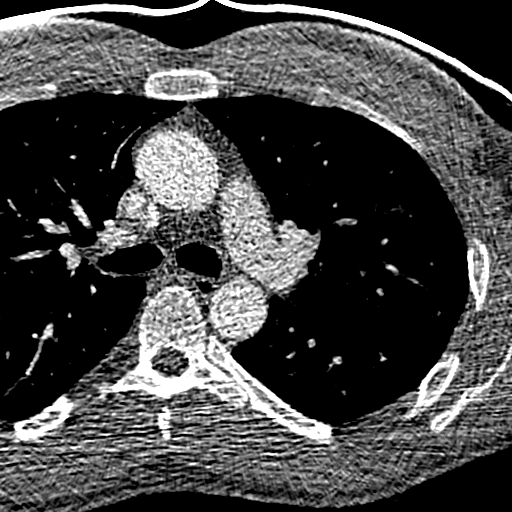
[im 3614/4170  lung]
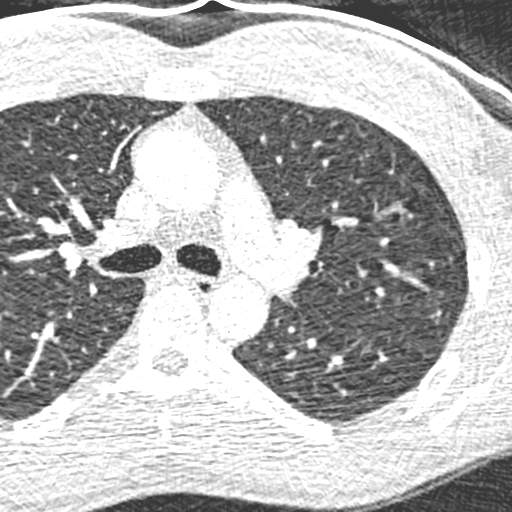
[im 3892/4170  vessel]
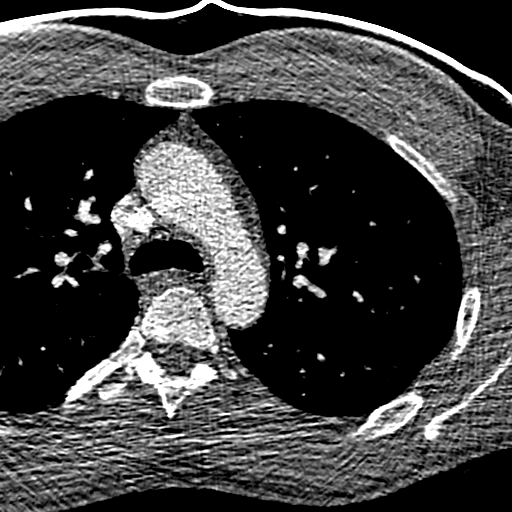

[14 of 20 positions shown; findings below may reference images not displayed]

FINDINGS: There were no significant non-cardiac findings on lung and soft
 tissue windows

 Non-coronary cardiac findings: All 4 cardiac chambers were normal.
 There was no ASD or VSD. There was no pericardial effusion. The
 aortic valve was trileaflet. EF was calculated at 63% with no
 RWMA's. Pulmonary veins drained into the LA in normal fashion.
 There was a right middle pulmonary vien

 Coronary Calcium Score: 0

 Coronary CTA. No coronary anomalies. Right coronary dominance
 although PDA not well visualized. LM, LAD, IM, D1, D2, circ and
 OM1 normal. Proximal and mid RCA normal. Distal RCA and PDA not
 well visualized
IMPRESSION: 1) Low risk cardiac CTA with normal origin of arteries. All
 segments normal except distal RCA and PDA not well seen.

 2) Normal EF 63% with no RWMA's
 3) Calcium Score = 0
 Note the patient had a mild allergic reaction to the dye in the
 recovery phase.

 He was given a script for prednisone taper. There were no
 reactions other than skin redining and hives.

## 2010-07-05 ENCOUNTER — Encounter: Payer: Self-pay | Admitting: Family Medicine

## 2010-07-10 ENCOUNTER — Telehealth: Payer: Self-pay | Admitting: Family Medicine

## 2010-07-10 ENCOUNTER — Other Ambulatory Visit (INDEPENDENT_AMBULATORY_CARE_PROVIDER_SITE_OTHER): Payer: BC Managed Care – PPO

## 2010-07-10 DIAGNOSIS — Z0000 Encounter for general adult medical examination without abnormal findings: Secondary | ICD-10-CM | POA: Insufficient documentation

## 2010-07-10 LAB — LIPID PANEL
Cholesterol: 121 mg/dL (ref 0–200)
HDL: 30.9 mg/dL — ABNORMAL LOW (ref >39.00)
LDL Cholesterol: 79 mg/dL (ref 0–99)
Total CHOL/HDL Ratio: 4
Triglycerides: 55 mg/dL (ref 0.0–149.0)
VLDL: 11 mg/dL (ref 0.0–40.0)

## 2010-07-10 LAB — CBC WITH DIFFERENTIAL/PLATELET
Basophils Absolute: 0 10*3/uL (ref 0.0–0.1)
Hemoglobin: 14.5 g/dL (ref 13.0–17.0)
Lymphs Abs: 1.8 10*3/uL (ref 0.7–4.0)
MCV: 90.8 fl (ref 78.0–100.0)
Monocytes Absolute: 0.4 10*3/uL (ref 0.1–1.0)
Neutrophils Relative %: 46.7 % (ref 43.0–77.0)
Platelets: 274 10*3/uL (ref 150.0–400.0)
RBC: 4.66 Mil/uL (ref 4.22–5.81)
RDW: 12.9 % (ref 11.5–14.6)
WBC: 4.3 10*3/uL — ABNORMAL LOW (ref 4.5–10.5)

## 2010-07-10 LAB — COMPREHENSIVE METABOLIC PANEL
Albumin: 4.4 g/dL (ref 3.5–5.2)
Alkaline Phosphatase: 32 U/L — ABNORMAL LOW (ref 39–117)
CO2: 28 mEq/L (ref 19–32)
Calcium: 9.6 mg/dL (ref 8.4–10.5)
Chloride: 106 mEq/L (ref 96–112)
GFR: 109.91 mL/min (ref >60.00)
Potassium: 5 mEq/L (ref 3.5–5.1)
Total Protein: 7 g/dL (ref 6.0–8.3)

## 2010-07-10 LAB — TSH: TSH: 2.04 u[IU]/mL (ref 0.35–5.50)

## 2010-07-10 NOTE — Telephone Encounter (Signed)
Message copied by Judy Pimple on Thu Jul 10, 2010  9:08 AM ------      Message from: Baldomero Lamy      Created: Wed Jul 09, 2010  1:13 PM      Regarding: Cpx labs tomorrow       Please order  future cpx labs for pt's upcomming lab appt.      Thanks      Rodney Booze

## 2010-07-10 NOTE — Telephone Encounter (Signed)
Message copied by Judy Pimple on Thu Jul 10, 2010  9:06 AM ------      Message from: Baldomero Lamy      Created: Wed Jul 09, 2010  1:13 PM      Regarding: Cpx labs tomorrow       Please order  future cpx labs for pt's upcomming lab appt.      Thanks      Rodney Booze

## 2010-07-14 ENCOUNTER — Ambulatory Visit (INDEPENDENT_AMBULATORY_CARE_PROVIDER_SITE_OTHER): Payer: BC Managed Care – PPO | Admitting: Family Medicine

## 2010-07-14 ENCOUNTER — Encounter: Payer: Self-pay | Admitting: Family Medicine

## 2010-07-14 VITALS — BP 120/82 | HR 76 | Temp 98.4°F | Ht 68.25 in | Wt 185.5 lb

## 2010-07-14 DIAGNOSIS — K219 Gastro-esophageal reflux disease without esophagitis: Secondary | ICD-10-CM

## 2010-07-14 DIAGNOSIS — E786 Lipoprotein deficiency: Secondary | ICD-10-CM | POA: Insufficient documentation

## 2010-07-14 DIAGNOSIS — Z0000 Encounter for general adult medical examination without abnormal findings: Secondary | ICD-10-CM

## 2010-07-14 MED ORDER — OMEPRAZOLE MAGNESIUM 20 MG PO TBEC: 20.0000 mg | DELAYED_RELEASE_TABLET | ORAL | Status: DC

## 2010-07-14 NOTE — Patient Instructions (Signed)
To raise HDL - you can take up to 3000 mg of fish oil daily Also work toward 30 minutes of exercise 5 days per week  Keep eating healthy balanced diet  Start back on omeprazole 20 mg daily for acid reflux

## 2010-07-14 NOTE — Assessment & Plan Note (Signed)
With heartburn more than 2 times per week and also hx of esoph ca in father I adv him to go back on omeprazole for this 20 mg daily Disc diet/ habits also

## 2010-07-14 NOTE — Progress Notes (Signed)
Subjective:    Patient ID: Darryl Jenkins, male    DOB: [birth year], 35 y.o.   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 get his masters   Td3/11  colonosc 3/07-- all was ok / no polyps -- does not need another until 49  No colon cancer in the family   fam hx- father with esoph ca / smoker and cerebral aneurysm Mother was dx with stage one breast cancer -doing well now    No longer needs heartburn medicine at all  Was anxious at the time - and much better now  Is having heartburn 2 or more times a week - typically at night  Never had side effects on omeprazole   Takes 1 fish oil per day   Lipids are improved- but low HDL  Lab Results  Component Value Date   CHOL 121 07/10/2010   CHOL 159 02/27/2009   Lab Results  Component Value Date   HDL 30.90* 07/10/2010   HDL 28.70* 02/27/2009   Lab Results  Component Value Date   LDLCALC 79 07/10/2010   LDLCALC 108* 02/27/2009   Lab Results  Component Value Date   TRIG 55.0 07/10/2010   TRIG 111.0 02/27/2009   Lab Results  Component Value Date   CHOLHDL 4 07/10/2010   CHOLHDL 6 02/27/2009   No results found for this basename: LDLDIRECT     Better than he used to be with fast food and fried foods Tries to be aware of that   Patient Active Problem List  Diagnoses  . GERD (gastroesophageal reflux disease)  . Routine general medical examination at a health care facility  . Low HDL (under 40)   Past Medical History  Diagnosis Date  . Family history of ischemic heart disease   . Chest pain, unspecified   . Other malaise and fatigue   . Pneumonia   . Hemorrhoids    No past surgical history on file. History  Substance Use Topics  . Smoking status: Never  Smoker   . Smokeless tobacco: Not on file  . Alcohol Use: Yes     occasional   Family History  Problem Relation Age of Onset  . Hypertension Brother   . Hypertension Brother   . Breast cancer Mother    Allergies  Allergen Reactions  . Iohexol      Code: RASH, Desc: pt developed rash post 100 ml omni 350 for cta coronary exam. steroid taper prescribed after exam was complete. bsw 03/21/2009., Onset Date: [id number]    Current Outpatient Prescriptions on File Prior to Visit  Medication Sig Dispense Refill  . Multiple Vitamin (MULTIVITAMIN) capsule Take 1 capsule by mouth daily.        . Omega-3 Fatty Acids (FISH OIL) 1000 MG CAPS Take 1 capsule by mouth daily.       . vitamin C (ASCORBIC ACID) 500 MG tablet Take 500 mg by mouth daily.               Review of Systems Review of Systems  Constitutional: Negative for fever, appetite change, fatigue and unexpected weight change.  Eyes: Negative for pain and visual disturbance.  Respiratory:  Negative for cough and shortness of breath.   Cardiovascular: Negative. For cp or palp  Gastrointestinal: Negative for nausea, diarrhea and constipation. pos for heartburn Genitourinary: Negative for urgency and frequency.  Skin: Negative for pallor.  Neurological: Negative for weakness, light-headedness, numbness and headaches.  Hematological: Negative for adenopathy. Does not bruise/bleed easily.  Psychiatric/Behavioral: Negative for dysphoric mood. The patient is not nervous/anxious.         Objective:   Physical Exam        Assessment & Plan:

## 2010-07-14 NOTE — Assessment & Plan Note (Signed)
Mildly low in 30s Disc omega 3 supplement (up to 3000 mg fish oil as tol) and regular exercise Will continue to monitor

## 2010-07-14 NOTE — Assessment & Plan Note (Signed)
Reviewed health habits including diet and exercise and skin cancer prevention Also reviewed health mt list, fam hx and immunizations  Rev wellness labs in detail  Recommend mvi if diet is not balanced

## 2010-10-27 ENCOUNTER — Encounter: Payer: Self-pay | Admitting: Family Medicine

## 2010-10-27 ENCOUNTER — Ambulatory Visit (INDEPENDENT_AMBULATORY_CARE_PROVIDER_SITE_OTHER): Payer: BC Managed Care – PPO | Admitting: Family Medicine

## 2010-10-27 DIAGNOSIS — J069 Acute upper respiratory infection, unspecified: Secondary | ICD-10-CM

## 2010-10-27 MED ORDER — CHLORPHENIRAMINE-HYDROCODONE 8-10 MG/5ML PO LQCR: 5.0000 mL | Freq: Two times a day (BID) | ORAL | Status: DC | PRN

## 2010-10-27 NOTE — Assessment & Plan Note (Signed)
With head and chest congestion and cough- but reassuring exam  Suspect cough is cyclic  Will try tussionex at night or day if not at work  Then mucinex DM instead while working or driving  Enc extra fluids and rest  Tylenol sinus if needed  Update if worse at all or not improved in 4-5 days - would consider abx and f/u

## 2010-10-27 NOTE — Patient Instructions (Signed)
I think you are approaching the end of a head and chest cold/ virus - and cough is common  Try mucinex DM during the day Use tussionex (px) at night  Drink lots of fluids  Breath steam If fever/ worse cough/ wheezing or not improving more in 4-5 days- please call

## 2010-10-27 NOTE — Progress Notes (Signed)
  Subjective:    Patient ID: Darryl Jenkins, male    DOB: Jul 05, 1975, 35 y.o.   MRN: [id number]  HPI Here for acute visit for uri symptoms  Having head congestion  With yellow mucous and sneezing along with prod cough (green mucous)  8 days ago this started with a pounding headache Missed work by Wednesday-- suspected fever -- and achey and chills Improved some and then started a bad cough on Friday - up all night with it   Colored mucous at this point  No more fever - just feels blah   No sinus pain noticeable  No n/v/d Some wheeze after a coughing spell  No "whoop"   Review of Systems     Objective:   Physical Exam  Constitutional: He appears well-developed and well-nourished. No distress.       Well appearing but with persistant cough  HENT:  Head: Normocephalic and atraumatic.  Right Ear: External ear normal.  Left Ear: External ear normal.  Mouth/Throat: Oropharynx is clear and moist.       Nares congested/ injected esp on the R  No sinus tenderness   Eyes: Conjunctivae and EOM are normal. Pupils are equal, round, and reactive to light. Right eye exhibits no discharge. Left eye exhibits no discharge.  Neck: Normal range of motion. Neck supple. No JVD present. No thyromegaly present.  Cardiovascular: Normal rate, regular rhythm and normal heart sounds.   Pulmonary/Chest: Effort normal and breath sounds normal. No respiratory distress. He has no wheezes. He has no rales. He exhibits no tenderness.       Harsh bs at bases No wheeze even on forced exp  Barky cough- no "whoop"  Lymphadenopathy:    He has no cervical adenopathy.  Neurological: He is alert.  Skin: Skin is warm and dry. No rash noted. No erythema. No pallor.  Psychiatric: He has a normal mood and affect.          Assessment & Plan:

## 2011-04-10 ENCOUNTER — Encounter: Payer: Self-pay | Admitting: Family Medicine

## 2011-04-10 ENCOUNTER — Ambulatory Visit (INDEPENDENT_AMBULATORY_CARE_PROVIDER_SITE_OTHER): Payer: BC Managed Care – PPO | Admitting: Family Medicine

## 2011-04-10 VITALS — BP 118/76 | HR 72 | Temp 98.0°F | Ht 68.25 in | Wt 199.8 lb

## 2011-04-10 DIAGNOSIS — J069 Acute upper respiratory infection, unspecified: Secondary | ICD-10-CM

## 2011-04-10 MED ORDER — AMOXICILLIN-POT CLAVULANATE 875-125 MG PO TABS: 1.0000 | ORAL_TABLET | Freq: Two times a day (BID) | ORAL | Status: AC

## 2011-04-10 NOTE — Assessment & Plan Note (Signed)
5 days- head cong and sinus pressure but not meeting criteria for bacterial sinusitis Disc symptomatic care - see instructions on AVS  If next week no imp or if inc pain/ fever/ purulent nasal drainage- will fill px for augmentin (is going out of town) Update if not starting to improve in a week or if worsening

## 2011-04-10 NOTE — Progress Notes (Signed)
Subjective:    Patient ID: Darryl Jenkins, male    DOB: 09-Oct-1975, 36 y.o.   MRN: [id number]  HPI ? Sinus infection Started feeling achey/ bad on Monday -- cong and runny nose and hoarseness/ barky cough (not prod ) No fever- but was achey- no chills R ear feels full but no painful  Sinus pain is under the eyes for the most part- feels very tight   Took advil and claritin yesterday Clear nasal d/c  Patient Active Problem List  Diagnoses  . GERD (gastroesophageal reflux disease)  . Routine general medical examination at a health care facility  . Low HDL (under 40)  . Viral URI   Past Medical History  Diagnosis Date  . Family history of ischemic heart disease   . Chest pain, unspecified   . Other malaise and fatigue   . Pneumonia   . Hemorrhoids    No past surgical history on file. History  Substance Use Topics  . Smoking status: Never Smoker   . Smokeless tobacco: Not on file  . Alcohol Use: Yes     occasional   Family History  Problem Relation Age of Onset  . Hypertension Brother   . Hypertension Brother   . Breast cancer Mother    Allergies  Allergen Reactions  . Iohexol      Code: RASH, Desc: pt developed rash post 100 ml omni 350 for cta coronary exam. steroid taper prescribed after exam was complete. bsw 03/21/2009., Onset Date: [id number]    Current Outpatient Prescriptions on File Prior to Visit  Medication Sig Dispense Refill  . Multiple Vitamin (MULTIVITAMIN) capsule Take 1 capsule by mouth 2 (two) times daily.       . Omega-3 Fatty Acids (FISH OIL) 1000 MG CAPS Take 1 capsule by mouth 2 (two) times daily.       Marland Kitchen omeprazole (PRILOSEC OTC) 20 MG tablet Take 1 tablet (20 mg total) by mouth every morning.  30 tablet  11  . vitamin C (ASCORBIC ACID) 500 MG tablet Take 500 mg by mouth daily.        . chlorpheniramine-hydrocodone (TUSSIONEX) 8-10 MG/5ML suspension Take 5 mLs by mouth every 12 (twelve) hours as needed for cough (watch out for sedation ).  60 mL   0  . pseudoephedrine-acetaminophen (TYLENOL SINUS) 30-500 MG TABS Take 1 tablet by mouth every 4 (four) hours as needed.               Review of Systems Review of Systems  Constitutional: Negative for unexpected weight change. pos for appetite loss and achiness (? Fever) Eyes: Negative for pain and visual disturbance.  ENt pos for cong rhinorrhea and sinus pressure , neg for ear pain  Respiratory: Negative for sob and wheeze    Cardiovascular: Negative for cp or palpitations    Gastrointestinal: Negative for nausea, diarrhea and constipation.  Genitourinary: Negative for urgency and frequency.  Skin: Negative for pallor or rash   Neurological: Negative for weakness, light-headedness, numbness and headaches.  Hematological: Negative for adenopathy. Does not bruise/bleed easily.  Psychiatric/Behavioral: Negative for dysphoric mood. The patient is not nervous/anxious.          Objective:   Physical Exam  Constitutional: He appears well-developed and well-nourished. No distress.  HENT:  Head: Normocephalic and atraumatic.  Right Ear: External ear normal.  Left Ear: External ear normal.  Mouth/Throat: Oropharynx is clear and moist.       Nares are injected and congested  No sinus tenderness Throat clear with some post nasal drip   Eyes: Conjunctivae and EOM are normal. Pupils are equal, round, and reactive to light. Right eye exhibits no discharge. Left eye exhibits no discharge.  Neck: Normal range of motion. Neck supple. No JVD present. No thyromegaly present.  Cardiovascular: Normal rate, regular rhythm and normal heart sounds.   Pulmonary/Chest: Effort normal and breath sounds normal. No respiratory distress. He has no wheezes. He has no rales.  Musculoskeletal: Normal range of motion. He exhibits no edema.  Lymphadenopathy:    He has no cervical adenopathy.  Neurological: He is alert.  Skin: Skin is warm and dry. No rash noted.  Psychiatric: He has a normal mood and  affect.          Assessment & Plan:

## 2011-04-10 NOTE — Patient Instructions (Signed)
I think you have a bad cold with congestion Drink lots of fluids and breath steam/ use compresses on face Try saline nasal spray to irrigate sinuses advil or aleve with food to decrease pain and congestion Add mucinex plain twice daily  By next week if worse -- ie: facial pain/ fever or achiness, green/ yellow nasal drainage- start antibiotic  Keep me updated

## 2011-06-18 ENCOUNTER — Telehealth: Payer: Self-pay

## 2011-06-18 DIAGNOSIS — Z3009 Encounter for other general counseling and advice on contraception: Secondary | ICD-10-CM

## 2011-06-18 NOTE — Telephone Encounter (Signed)
Will do ref  

## 2011-06-18 NOTE — Telephone Encounter (Signed)
Darryl Jenkins pts wife left v/m  for pt (said pt could not call due to his work)requesting referral for urologist for vasectomy. Darryl can be reached at [phone] and can leave detailed message if no answer. Pt last seen 04/10/11.

## 2011-07-08 ENCOUNTER — Other Ambulatory Visit: Payer: Self-pay | Admitting: Family Medicine

## 2011-07-14 ENCOUNTER — Other Ambulatory Visit (INDEPENDENT_AMBULATORY_CARE_PROVIDER_SITE_OTHER): Payer: BC Managed Care – PPO

## 2011-07-14 ENCOUNTER — Telehealth: Payer: Self-pay | Admitting: Family Medicine

## 2011-07-14 DIAGNOSIS — E786 Lipoprotein deficiency: Secondary | ICD-10-CM

## 2011-07-14 DIAGNOSIS — Z0000 Encounter for general adult medical examination without abnormal findings: Secondary | ICD-10-CM

## 2011-07-14 LAB — CBC WITH DIFFERENTIAL/PLATELET
Eosinophils Relative: 1.8 % (ref 0.0–5.0)
Lymphocytes Relative: 34.6 % (ref 12.0–46.0)
Monocytes Relative: 7.6 % (ref 3.0–12.0)
Neutro Abs: 2.8 10*3/uL (ref 1.4–7.7)
Neutrophils Relative %: 55.1 % (ref 43.0–77.0)
RDW: 13 % (ref 11.5–14.6)

## 2011-07-14 LAB — LIPID PANEL
Cholesterol: 134 mg/dL (ref 0–200)
LDL Cholesterol: 80 mg/dL (ref 0–99)
Total CHOL/HDL Ratio: 4
Triglycerides: 113 mg/dL (ref 0.0–149.0)

## 2011-07-14 LAB — COMPREHENSIVE METABOLIC PANEL
AST: 24 U/L (ref 0–37)
Alkaline Phosphatase: 33 U/L — ABNORMAL LOW (ref 39–117)
CO2: 26 mEq/L (ref 19–32)
Chloride: 107 mEq/L (ref 96–112)
Creatinine, Ser: 0.9 mg/dL (ref 0.4–1.5)
GFR: 104.95 mL/min (ref >60.00)
Glucose, Bld: 73 mg/dL (ref 70–99)
Total Bilirubin: 1.1 mg/dL (ref 0.3–1.2)
Total Protein: 7.5 g/dL (ref 6.0–8.3)

## 2011-07-14 LAB — TSH: TSH: 1.87 u[IU]/mL (ref 0.35–5.50)

## 2011-07-14 NOTE — Telephone Encounter (Signed)
Message copied by Judy Pimple on Tue Jul 14, 2011  7:20 AM ------      Message from: Baldomero Lamy      Created: Thu Jul 09, 2011  1:38 PM      Regarding: Cpx labs Tues 6/4       Please order  future cpx labs for pt's upcomming lab appt.      Thanks      Rodney Booze

## 2011-07-20 ENCOUNTER — Encounter: Payer: Self-pay | Admitting: Family Medicine

## 2011-07-20 ENCOUNTER — Ambulatory Visit (INDEPENDENT_AMBULATORY_CARE_PROVIDER_SITE_OTHER): Payer: BC Managed Care – PPO | Admitting: Family Medicine

## 2011-07-20 VITALS — BP 120/78 | HR 68 | Temp 97.9°F | Ht 68.75 in | Wt 194.2 lb

## 2011-07-20 DIAGNOSIS — L723 Sebaceous cyst: Secondary | ICD-10-CM

## 2011-07-20 DIAGNOSIS — K219 Gastro-esophageal reflux disease without esophagitis: Secondary | ICD-10-CM

## 2011-07-20 DIAGNOSIS — L729 Follicular cyst of the skin and subcutaneous tissue, unspecified: Secondary | ICD-10-CM

## 2011-07-20 DIAGNOSIS — Z0000 Encounter for general adult medical examination without abnormal findings: Secondary | ICD-10-CM

## 2011-07-20 NOTE — Assessment & Plan Note (Signed)
Stable on omeprazole Pt unable to come off of due to recurrent heart burn Also father died of esoph cancer

## 2011-07-20 NOTE — Patient Instructions (Signed)
Continue fish oil and exercise for cholesterol  We will do dermatology referral at check out  Keep up good diet and exercise

## 2011-07-20 NOTE — Assessment & Plan Note (Signed)
L top of scalp- over 1 cm- is mobile/ soft/ growing Ref to derm for eval of this since it has changed in size

## 2011-07-20 NOTE — Progress Notes (Signed)
Subjective:    Patient ID: Darryl Jenkins, male    DOB: 02-Sep-1975, 36 y.o.   MRN: [id number]  HPI Here for health maintenance exam and to review chronic medical problems   Is doing pretty well overall  Is working and taking care of kids   No medical issues   Is scheduled for vasectomy - in august, done having children at this time    bp good 120/78  Wt is down 5 lb with bmi of 28 Is trying to loose- very active again (not as much in winter) Is coaching T ball and soft ball , and plays ball too    Labs ok    Chemistry      Component Value Date/Time   NA 141 07/14/2011 0837   K 4.4 07/14/2011 0837   CL 107 07/14/2011 0837   CO2 26 07/14/2011 0837   BUN 11 07/14/2011 0837   CREATININE 0.9 07/14/2011 0837      Component Value Date/Time   CALCIUM 9.4 07/14/2011 0837   ALKPHOS 33* 07/14/2011 0837   AST 24 07/14/2011 0837   ALT 20 07/14/2011 0837   BILITOT 1.1 07/14/2011 0837      Lab Results  Component Value Date   WBC 5.2 07/14/2011   HGB 14.7 07/14/2011   HCT 44.4 07/14/2011   MCV 90.6 07/14/2011   PLT 271.0 07/14/2011    Lab Results  Component Value Date   TSH 1.87 07/14/2011     Lipids   Lab Results  Component Value Date   CHOL 134 07/14/2011   CHOL 121 07/10/2010   CHOL 159 02/27/2009   Lab Results  Component Value Date   HDL 31.10* 07/14/2011   HDL 13.08* 07/10/2010   HDL 28.70* 02/27/2009   Lab Results  Component Value Date   LDLCALC 80 07/14/2011   LDLCALC 79 07/10/2010   LDLCALC 108* 02/27/2009   Lab Results  Component Value Date   TRIG 113.0 07/14/2011   TRIG 55.0 07/10/2010   TRIG 111.0 02/27/2009   Lab Results  Component Value Date   CHOLHDL 4 07/14/2011   CHOLHDL 4 07/10/2010   CHOLHDL 6 02/27/2009   No results found for this basename: LDLDIRECT   diet- is getting better  Is taking fish oil regularly and exercise   Has a cyst on head- had it for years and getting bigger  Not painful Sometimes hits it with comb or brush Not draining or bleeding Hair growth is not  interrupted there   Flu shot- had it in the fall  Td 3/11   Patient Active Problem List  Diagnoses  . GERD (gastroesophageal reflux disease)  . Routine general medical examination at a health care facility  . Low HDL (under 40)  . Viral URI  . Visit for vasectomy evaluation   Past Medical History  Diagnosis Date  . Family history of ischemic heart disease   . Chest pain, unspecified   . Other malaise and fatigue   . Pneumonia   . Hemorrhoids    No past surgical history on file. History  Substance Use Topics  . Smoking status: Never Smoker   . Smokeless tobacco: Not on file  . Alcohol Use: No   Family History  Problem Relation Age of Onset  . Hypertension Brother   . Hypertension Brother   . Breast cancer Mother    Allergies  Allergen Reactions  . Iohexol      Code: RASH, Desc: pt developed rash post 100 ml  omni 350 for cta coronary exam. steroid taper prescribed after exam was complete. bsw 03/21/2009., Onset Date: [id number]    Current Outpatient Prescriptions on File Prior to Visit  Medication Sig Dispense Refill  . Multiple Vitamin (MULTIVITAMIN) capsule Take 1 capsule by mouth 2 (two) times daily.       . Omega-3 Fatty Acids (FISH OIL) 1000 MG CAPS Take 1 capsule by mouth 2 (two) times daily.       . Omeprazole 20 MG TBEC TAKE 1 TABLET (20 MG TOTAL) BY MOUTH EVERY MORNING.  30 tablet  11  . vitamin C (ASCORBIC ACID) 500 MG tablet Take 500 mg by mouth daily.        Marland Kitchen DISCONTD: omeprazole (PRILOSEC OTC) 20 MG tablet Take 1 tablet (20 mg total) by mouth every morning.  30 tablet  11      Review of Systems Review of Systems  Constitutional: Negative for fever, appetite change, fatigue and unexpected weight change.  Eyes: Negative for pain and visual disturbance.  Respiratory: Negative for cough and shortness of breath.   Cardiovascular: Negative for cp or palpitations    Gastrointestinal: Negative for nausea, diarrhea and constipation.  Genitourinary: Negative  for urgency and frequency.  Skin: Negative for pallor or rash  pos for lump on scalp Neurological: Negative for weakness, light-headedness, numbness and headaches.  Hematological: Negative for adenopathy. Does not bruise/bleed easily.  Psychiatric/Behavioral: Negative for dysphoric mood. The patient is not nervous/anxious.         Objective:   Physical Exam  Constitutional: He appears well-developed and well-nourished. No distress.  HENT:  Head: Normocephalic and atraumatic.  Right Ear: External ear normal.  Left Ear: External ear normal.  Nose: Nose normal.  Mouth/Throat: Oropharynx is clear and moist. No oropharyngeal exudate.  Eyes: Conjunctivae and EOM are normal. Pupils are equal, round, and reactive to light. No scleral icterus.  Neck: Normal range of motion. Neck supple. No JVD present. Carotid bruit is not present. No thyromegaly present.  Cardiovascular: Normal rate, regular rhythm, normal heart sounds and intact distal pulses.  Exam reveals no gallop.   Pulmonary/Chest: Effort normal and breath sounds normal. No respiratory distress. He has no wheezes.  Abdominal: Soft. Bowel sounds are normal. He exhibits no distension, no abdominal bruit and no mass. There is no tenderness.  Musculoskeletal: Normal range of motion. He exhibits no edema and no tenderness.  Lymphadenopathy:    He has no cervical adenopathy.  Neurological: He is alert. He has normal reflexes. No cranial nerve deficit. He exhibits normal muscle tone. Coordination normal.  Skin: Skin is warm and dry. No rash noted. No erythema. No pallor.       Lump on L mid scalp is about 1-1.5 cm and soft and mobile Feels like a seb cyst No drainage or skin or hair change in the area   Psychiatric: He has a normal mood and affect.          Assessment & Plan:

## 2011-07-20 NOTE — Assessment & Plan Note (Signed)
Reviewed health habits including diet and exercise and skin cancer prevention Also reviewed health mt list, fam hx and immunizations  Rev wellness labs  HDL is still low -despite exercise and fish oil- will keep working on that

## 2011-12-18 ENCOUNTER — Telehealth: Payer: Self-pay

## 2011-12-18 NOTE — Telephone Encounter (Signed)
Pt wants 07/2011 ov and lab results for health insurance assessment. Pt signed record release and took info with him.

## 2012-02-01 ENCOUNTER — Encounter: Payer: Self-pay | Admitting: Family Medicine

## 2012-02-01 ENCOUNTER — Ambulatory Visit (INDEPENDENT_AMBULATORY_CARE_PROVIDER_SITE_OTHER): Payer: BC Managed Care – PPO | Admitting: Family Medicine

## 2012-02-01 VITALS — BP 122/78 | HR 66 | Temp 98.1°F | Wt 201.0 lb

## 2012-02-01 DIAGNOSIS — J209 Acute bronchitis, unspecified: Secondary | ICD-10-CM | POA: Insufficient documentation

## 2012-02-01 MED ORDER — HYDROCOD POLST-CHLORPHEN POLST 10-8 MG/5ML PO LQCR: 5.0000 mL | Freq: Every evening | ORAL | Status: DC | PRN

## 2012-02-01 NOTE — Patient Instructions (Signed)
I think you do have bronchitis - treat with cough syrup for night time. If symptoms persist past end of week, let me know. If worsening productive cough, fevers >101, or sudden deterioration after initially improving, call us. tussionex for cough at night.

## 2012-02-01 NOTE — Progress Notes (Signed)
  Subjective:    Patient ID: Darryl Jenkins, male    DOB: 06-13-1975, 36 y.o.   MRN: [id number]  HPI CC: cough  6d h/o chest congestion, led to productive cough of yellow sputum.  Cough worsening recently.  + chest pain each time he coughs.  Now dry cough.  subjective fevers.  Ears congested.  Some muscle aches.  Some coughing fits.  No post tussive emesis/gag.  So far has tried mucinex to thin secretions, plenty of water, natural remedies like green tea and honey.  Advil.    No chills, abd pain, nausea/vomiting, rashes, tooth pain, HA, coryza, rhinorrhea.  No smokers at home. Son sick recently with fever to 102.  + sick family recently. H/o PNA 10 yrs ago. No h/o asthma.  Past Medical History  Diagnosis Date  . Family history of ischemic heart disease   . Chest pain, unspecified   . Other malaise and fatigue   . Pneumonia   . Hemorrhoids      Review of Systems Per HPI    Objective:   Physical Exam  Nursing note and vitals reviewed. Constitutional: He appears well-developed and well-nourished. No distress.  HENT:  Head: Normocephalic and atraumatic.  Right Ear: Hearing, tympanic membrane, external ear and ear canal normal.  Left Ear: Hearing, tympanic membrane, external ear and ear canal normal.  Nose: Nose normal. No mucosal edema or rhinorrhea. Right sinus exhibits no maxillary sinus tenderness and no frontal sinus tenderness. Left sinus exhibits no maxillary sinus tenderness and no frontal sinus tenderness.  Mouth/Throat: Uvula is midline, oropharynx is clear and moist and mucous membranes are normal. No oropharyngeal exudate, posterior oropharyngeal edema, posterior oropharyngeal erythema or tonsillar abscesses.  Eyes: Conjunctivae normal and EOM are normal. Pupils are equal, round, and reactive to light. No scleral icterus.  Neck: Normal range of motion. Neck supple.  Cardiovascular: Normal rate, regular rhythm, normal heart sounds and intact distal pulses.   No murmur  heard. Pulmonary/Chest: Effort normal and breath sounds normal. No respiratory distress. He has no wheezes. He has no rales.       Lungs clear  Lymphadenopathy:    He has no cervical adenopathy.  Skin: Skin is warm and dry. No rash noted.       Assessment & Plan:

## 2012-02-01 NOTE — Assessment & Plan Note (Signed)
Has received pneumonia shot.  Has not received Tdap. Anticipate viral bronchitis. Supportive care as per instructions. tussionex for cough at night. Update if worsening or not improving as expected for abx course.

## 2012-02-04 ENCOUNTER — Ambulatory Visit (INDEPENDENT_AMBULATORY_CARE_PROVIDER_SITE_OTHER): Payer: BC Managed Care – PPO | Admitting: Family Medicine

## 2012-02-04 ENCOUNTER — Encounter: Payer: Self-pay | Admitting: Family Medicine

## 2012-02-04 VITALS — BP 120/84 | HR 80 | Temp 98.6°F | Wt 200.0 lb

## 2012-02-04 DIAGNOSIS — J209 Acute bronchitis, unspecified: Secondary | ICD-10-CM

## 2012-02-04 MED ORDER — AZITHROMYCIN 250 MG PO TABS: ORAL_TABLET | ORAL | Status: DC

## 2012-02-04 NOTE — Patient Instructions (Signed)
Take zpack.  Continue tussionex twice daily - caution may make you sleepy Update [place] if not improving as expected with antibiotics.

## 2012-02-04 NOTE — Progress Notes (Signed)
  Subjective:    Patient ID: Darryl Jenkins, male    DOB: [birth year], 36 y.o.   MRN: [id number]  HPI CC: continued cough.  See prior note for details. Seen here 02/01/2012 with dx viral bronchitis, treated with tussionex for cough. Not better.  Actually worse - Yesterday awoke at [facility] with chills, fever.  Worsening ST as well.  H/o PNA.  Now 10 d of sxs.   Review of Systems Per HPI    Objective:   Physical Exam  Nursing note and vitals reviewed. Constitutional: He appears well-developed and well-nourished. No distress.  HENT:  Head: Normocephalic and atraumatic.  Right Ear: Hearing, tympanic membrane, external ear and ear canal normal.  Left Ear: Hearing, tympanic membrane, external ear and ear canal normal.  Nose: No mucosal edema or rhinorrhea.  Mouth/Throat: Uvula is midline and mucous membranes are normal. Posterior oropharyngeal erythema present. No oropharyngeal exudate, posterior oropharyngeal edema or tonsillar abscesses.       Raw posterior oropharynx  Eyes: Conjunctivae normal and EOM are normal. Pupils are equal, round, and reactive to light. No scleral icterus.  Neck: Normal range of motion. Neck supple.  Cardiovascular: Normal rate, regular rhythm, normal heart sounds and intact distal pulses.   No murmur heard. Pulmonary/Chest: Effort normal and breath sounds normal. No respiratory distress. He has no wheezes. He has no rales. He exhibits no tenderness.       Lungs clear       Assessment & Plan:

## 2012-02-04 NOTE — Assessment & Plan Note (Signed)
Worsening sxs, cover atypical bacterial bronchitis with zpack.  Continue tussionex.  Update [place] if sxs persist past 1-2 wks or further deterioration.

## 2012-05-09 ENCOUNTER — Telehealth: Payer: Self-pay | Admitting: General Surgery

## 2012-05-09 NOTE — Telephone Encounter (Signed)
The patient underwent a colonoscopy on 04/27/2012. A single small adenomatous polyp was removed from the proximal transverse colon. There was no high-grade dysplasia or malignancy identified. The patient should plan on a followup examination in 5 years. A message was left on his cell phone.

## 2012-07-05 ENCOUNTER — Other Ambulatory Visit: Payer: Self-pay | Admitting: Family Medicine

## 2012-07-05 NOTE — Telephone Encounter (Signed)
Pt was due for CPE and appt wasn't scheduled yet, call pt an scheduled CPE with labs prior and refilled med until then

## 2012-08-16 ENCOUNTER — Telehealth: Payer: Self-pay | Admitting: Family Medicine

## 2012-08-16 DIAGNOSIS — Z0000 Encounter for general adult medical examination without abnormal findings: Secondary | ICD-10-CM

## 2012-08-16 NOTE — Telephone Encounter (Signed)
Message copied by Judy Pimple on Tue Aug 16, 2012  7:55 AM ------      Message from: Alvina Chou      Created: Wed Aug 10, 2012  8:39 AM      Regarding: Lab orders for Wednesday, 7.9.14       Patient is scheduled for CPX labs, please order future labs, Thanks , Terri       ------

## 2012-08-17 ENCOUNTER — Other Ambulatory Visit (INDEPENDENT_AMBULATORY_CARE_PROVIDER_SITE_OTHER): Payer: BC Managed Care – PPO

## 2012-08-17 DIAGNOSIS — Z0000 Encounter for general adult medical examination without abnormal findings: Secondary | ICD-10-CM

## 2012-08-17 LAB — COMPREHENSIVE METABOLIC PANEL
ALT: 18 U/L (ref 0–53)
Albumin: 4.3 g/dL (ref 3.5–5.2)
CO2: 27 mEq/L (ref 19–32)
Calcium: 9.3 mg/dL (ref 8.4–10.5)
Chloride: 107 mEq/L (ref 96–112)
Creatinine, Ser: 0.8 mg/dL (ref 0.4–1.5)
GFR: 108.64 mL/min (ref >60.00)
Potassium: 4.4 mEq/L (ref 3.5–5.1)
Total Protein: 7.1 g/dL (ref 6.0–8.3)

## 2012-08-17 LAB — CBC WITH DIFFERENTIAL/PLATELET
Basophils Absolute: 0 10*3/uL (ref 0.0–0.1)
Hemoglobin: 14.8 g/dL (ref 13.0–17.0)
Lymphocytes Relative: 32.3 % (ref 12.0–46.0)
Monocytes Relative: 6.6 % (ref 3.0–12.0)
Neutro Abs: 3.5 10*3/uL (ref 1.4–7.7)
Neutrophils Relative %: 58.7 % (ref 43.0–77.0)
RBC: 4.77 Mil/uL (ref 4.22–5.81)
RDW: 12.4 % (ref 11.5–14.6)

## 2012-08-17 LAB — TSH: TSH: 2.07 u[IU]/mL (ref 0.35–5.50)

## 2012-08-17 LAB — LIPID PANEL
HDL: 31.8 mg/dL — ABNORMAL LOW (ref >39.00)
Total CHOL/HDL Ratio: 4

## 2012-08-19 ENCOUNTER — Encounter: Payer: Self-pay | Admitting: Family Medicine

## 2012-08-19 ENCOUNTER — Ambulatory Visit (INDEPENDENT_AMBULATORY_CARE_PROVIDER_SITE_OTHER): Payer: BC Managed Care – PPO | Admitting: Family Medicine

## 2012-08-19 VITALS — BP 122/86 | HR 67 | Temp 98.3°F | Ht 68.5 in | Wt 186.8 lb

## 2012-08-19 DIAGNOSIS — E786 Lipoprotein deficiency: Secondary | ICD-10-CM

## 2012-08-19 DIAGNOSIS — Z0000 Encounter for general adult medical examination without abnormal findings: Secondary | ICD-10-CM

## 2012-08-19 DIAGNOSIS — K219 Gastro-esophageal reflux disease without esophagitis: Secondary | ICD-10-CM

## 2012-08-19 MED ORDER — OMEPRAZOLE 20 MG PO CPDR: 20.0000 mg | DELAYED_RELEASE_CAPSULE | Freq: Every day | ORAL | Status: DC

## 2012-08-19 NOTE — Assessment & Plan Note (Signed)
Pt tried coming off omeprazole and symptoms returned-he is on 20 mg daily and will continue along with diet

## 2012-08-19 NOTE — Patient Instructions (Addendum)
No change in medicines  Stay active  Keep taking fish oil and exercise to raise good cholesterol Avoid red meat/ fried foods/ egg yolks/ fatty breakfast meats/ butter, cheese and high fat dairy/ and shellfish

## 2012-08-19 NOTE — Assessment & Plan Note (Signed)
Disc fish oil/ omega 3 and also exercise Urged to keep working on it Disc goals for lipids and reasons to control them Rev labs with pt Rev low sat fat diet in detail

## 2012-08-19 NOTE — Progress Notes (Signed)
Subjective:    Patient ID: Darryl Jenkins, male    DOB: 01/09/1976, 37 y.o.   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- more active and not eating as much  Had fun  Walks for exercise currently and plays softball (once per week)  Flu vacc- did get it this past season  Colonoscopy was 07 - no recommendation for f/u   Td 3/11  Family hx - mother with breast cancer  No colon cancer in family   Mood - overall chipper and stays motivated   Labs Lab Results  Component Value Date   CHOL 138 08/17/2012   CHOL 134 07/14/2011   CHOL 121 07/10/2010   Lab Results  Component Value Date   HDL 31.80* 08/17/2012   HDL 40.98* 07/14/2011   HDL 11.91* 07/10/2010   Lab Results  Component Value Date   LDLCALC 94 08/17/2012   LDLCALC 80 07/14/2011   LDLCALC 79 07/10/2010   Lab Results  Component Value Date   TRIG 59.0 08/17/2012   TRIG 113.0 07/14/2011   TRIG 55.0 07/10/2010   Lab Results  Component Value Date   CHOLHDL 4 08/17/2012   CHOLHDL 4 07/14/2011   CHOLHDL 4 07/10/2010   No results found for this basename: LDLDIRECT   hx of low HDL- takes fish oil and does exercise  He does eat well for the most part  Habits - are good overall   Glucose 105 - no symptoms at all   Other labs WNL  Patient Active Problem List   Diagnosis Date Noted  . Visit for vasectomy evaluation 06/18/2011  . Low HDL (under 40) 07/14/2010  . Routine general medical examination at a health care facility 07/10/2010  . GERD (gastroesophageal reflux disease) 03/27/2009   Past Medical History  Diagnosis Date  . Family history of ischemic heart disease   . Chest pain, unspecified   . Other malaise and fatigue   . Pneumonia   . Hemorrhoids    No past surgical history on file. History  Substance Use Topics  . Smoking status: Never Smoker    . Smokeless tobacco: Never Used  . Alcohol Use: Yes     Comment: rare   Family History  Problem Relation Age of Onset  . Hypertension Brother   . Hypertension Brother   . Breast cancer Mother    Allergies  Allergen Reactions  . Iohexol      Code: RASH, Desc: pt developed rash post 100 ml omni 350 for cta coronary exam. steroid taper prescribed after exam was complete. bsw 03/21/2009., Onset Date: [id number]    Current Outpatient Prescriptions on File Prior to Visit  Medication Sig Dispense Refill  . Multiple Vitamin (MULTIVITAMIN) capsule Take 1 capsule by mouth 2 (two) times daily.       . Omega-3 Fatty Acids (FISH OIL) 1000 MG CAPS Take 1 capsule by mouth 2 (two) times daily.       . Omeprazole 20 MG TBEC TAKE 1 TABLET (20 MG TOTAL) BY MOUTH EVERY MORNING.  30 tablet  1  . vitamin C (ASCORBIC ACID) 500 MG tablet Take 500 mg by mouth daily.        . [DISCONTINUED] omeprazole (PRILOSEC OTC) 20 MG tablet Take 1 tablet (20 mg total) by  mouth every morning.  30 tablet  11   No current facility-administered medications on file prior to visit.       Review of Systems Review of Systems  Constitutional: Negative for fever, appetite change, fatigue and unexpected weight change.  Eyes: Negative for pain and visual disturbance.  Respiratory: Negative for cough and shortness of breath.   Cardiovascular: Negative for cp or palpitations    Gastrointestinal: Negative for nausea, diarrhea and constipation.  Genitourinary: Negative for urgency and frequency.  Skin: Negative for pallor or rash   Neurological: Negative for weakness, light-headedness, numbness and headaches.  Hematological: Negative for adenopathy. Does not bruise/bleed easily.  Psychiatric/Behavioral: Negative for dysphoric mood. The patient is not nervous/anxious.         Objective:   Physical Exam  Constitutional: He appears well-developed and well-nourished. No distress.  HENT:  Head: Normocephalic and atraumatic.   Right Ear: External ear normal.  Left Ear: External ear normal.  Nose: Nose normal.  Mouth/Throat: Oropharynx is clear and moist.  Eyes: Conjunctivae and EOM are normal. Pupils are equal, round, and reactive to light. Right eye exhibits no discharge. Left eye exhibits no discharge. No scleral icterus.  Neck: Normal range of motion. Neck supple. No JVD present. Carotid bruit is not present. No thyromegaly present.  Cardiovascular: Normal rate, regular rhythm, normal heart sounds and intact distal pulses.  Exam reveals no gallop.   Pulmonary/Chest: Effort normal and breath sounds normal. No respiratory distress. He has no wheezes.  Abdominal: Soft. Bowel sounds are normal. He exhibits no distension, no abdominal bruit and no mass. There is no tenderness.  Musculoskeletal: He exhibits no edema and no tenderness.  Lymphadenopathy:    He has no cervical adenopathy.  Neurological: He is alert. He has normal reflexes. No cranial nerve deficit. He exhibits normal muscle tone. Coordination normal.  Skin: Skin is warm and dry. No rash noted. No erythema. No pallor.  Psychiatric: He has a normal mood and affect.          Assessment & Plan:

## 2012-08-19 NOTE — Assessment & Plan Note (Signed)
Reviewed health habits including diet and exercise and skin cancer prevention Also reviewed health mt list, fam hx and immunizations  Rev wellness lab in detail

## 2012-12-15 ENCOUNTER — Other Ambulatory Visit: Payer: Self-pay

## 2013-01-18 ENCOUNTER — Ambulatory Visit (INDEPENDENT_AMBULATORY_CARE_PROVIDER_SITE_OTHER): Payer: BC Managed Care – PPO | Admitting: Family Medicine

## 2013-01-18 ENCOUNTER — Encounter: Payer: Self-pay | Admitting: Family Medicine

## 2013-01-18 VITALS — BP 122/70 | HR 66 | Temp 98.1°F | Ht 69.0 in | Wt 195.8 lb

## 2013-01-18 DIAGNOSIS — J069 Acute upper respiratory infection, unspecified: Secondary | ICD-10-CM

## 2013-01-18 MED ORDER — BENZONATATE 200 MG PO CAPS: 200.0000 mg | ORAL_CAPSULE | Freq: Three times a day (TID) | ORAL | Status: DC | PRN

## 2013-01-18 NOTE — Progress Notes (Signed)
Subjective:    Patient ID: Darryl Jenkins, male    DOB: 1975-11-23, 37 y.o.   MRN: [id number]  HPI Sick for just over a week  Tickle in throat and then run down/ hoarse and achey - rested over the weekend  Monday- felt a bit better - back at work  tues -much worse/ last night - started coughing more severely / kept him up Head feels full/ sinus pressure and nose is clear  Phlegm in ams - coughs it up  Yellow mucous from nose   No fever at all   Voice is back  Throat is a bit sore when he coughs  No fever - just tired  mucinex since Sunday - plain (not DM)   Patient Active Problem List   Diagnosis Date Noted  . Low HDL (under 40) 07/14/2010  . Routine general medical examination at a health care facility 07/10/2010  . GERD (gastroesophageal reflux disease) 03/27/2009   Past Medical History  Diagnosis Date  . Family history of ischemic heart disease   . Chest pain, unspecified   . Other malaise and fatigue   . Pneumonia   . Hemorrhoids    No past surgical history on file. History  Substance Use Topics  . Smoking status: Never Smoker   . Smokeless tobacco: Never Used  . Alcohol Use: Yes     Comment: rare   Family History  Problem Relation Age of Onset  . Hypertension Brother   . Hypertension Brother   . Breast cancer Mother    Allergies  Allergen Reactions  . Iohexol      Code: RASH, Desc: pt developed rash post 100 ml omni 350 for cta coronary exam. steroid taper prescribed after exam was complete. bsw 03/21/2009., Onset Date: [id number]    Current Outpatient Prescriptions on File Prior to Visit  Medication Sig Dispense Refill  . Multiple Vitamin (MULTIVITAMIN) capsule Take 1 capsule by mouth 2 (two) times daily.       . Omega-3 Fatty Acids (FISH OIL) 1000 MG CAPS Take 1 capsule by mouth 2 (two) times daily.       Marland Kitchen omeprazole (PRILOSEC) 20 MG capsule Take 1 capsule (20 mg total) by mouth daily.  90 capsule  3  . vitamin C (ASCORBIC ACID) 500 MG tablet Take  500 mg by mouth daily.        . [DISCONTINUED] omeprazole (PRILOSEC OTC) 20 MG tablet Take 1 tablet (20 mg total) by mouth every morning.  30 tablet  11   No current facility-administered medications on file prior to visit.      Review of Systems Review of Systems  Constitutional: Negative for fever, appetite change, and unexpected weight change.  ENT pos for congestion and rhinorrhea and post nasal drip  Eyes: Negative for pain and visual disturbance.  Respiratory: Negative for wheeze and shortness of breath.   Cardiovascular: Negative for cp or palpitations    Gastrointestinal: Negative for nausea, diarrhea and constipation.  Genitourinary: Negative for urgency and frequency.  Skin: Negative for pallor or rash   Neurological: Negative for weakness, light-headedness, numbness and headaches.  Hematological: Negative for adenopathy. Does not bruise/bleed easily.  Psychiatric/Behavioral: Negative for dysphoric mood. The patient is not nervous/anxious.         Objective:   Physical Exam  Constitutional: He appears well-developed and well-nourished. No distress.  HENT:  Head: Normocephalic and atraumatic.  Right Ear: External ear normal.  Left Ear: External ear normal.  Mouth/Throat: Oropharynx  is clear and moist. No oropharyngeal exudate.  Nares are injected and congested  No sinus tenderness Clear rhinorrhea and post nasal drip   Eyes: Conjunctivae and EOM are normal. Pupils are equal, round, and reactive to light.  Neck: Normal range of motion. Neck supple.  Cardiovascular: Normal rate and regular rhythm.   Pulmonary/Chest: Effort normal and breath sounds normal. No respiratory distress. He has no wheezes. He has no rales.  No wheeze Harsh dry cough   Lymphadenopathy:    He has no cervical adenopathy.  Neurological: He is alert. No cranial nerve deficit.  Skin: Skin is warm and dry. No rash noted. No erythema. No pallor.  Psychiatric: He has a normal mood and affect.           Assessment & Plan:

## 2013-01-18 NOTE — Patient Instructions (Signed)
I think you have a viral head and chest cold that it going to take a while to get better If worse symptoms or fever let us know  Try mucinex DM as directed  Fluids / rest  teassalon also for cough as needed   Update if not starting to improve in a week or if worsening

## 2013-01-18 NOTE — Progress Notes (Signed)
Pre-visit discussion using our clinic review tool. No additional management support is needed unless otherwise documented below in the visit note.  

## 2013-01-22 NOTE — Assessment & Plan Note (Signed)
Disc symptomatic care - see instructions on AVS Will try mucinex DM for day and tessalon if needed -inst given Update if not starting to improve in a week or if worsening  -esp if sinus pain or fever

## 2013-08-18 ENCOUNTER — Other Ambulatory Visit: Payer: Self-pay | Admitting: Family Medicine

## 2013-08-20 ENCOUNTER — Telehealth: Payer: Self-pay | Admitting: Family Medicine

## 2013-08-20 DIAGNOSIS — Z0000 Encounter for general adult medical examination without abnormal findings: Secondary | ICD-10-CM

## 2013-08-20 NOTE — Telephone Encounter (Signed)
Message copied by Judy PimpleWER, MARNE A on Sun Aug 20, 2013  5:52 PM ------      Message from: Alvina ChouWALSH, TERRI J      Created: Wed Aug 16, 2013  5:25 PM      Regarding: Lab orders for Monday, 7.13.15       Patient is scheduled for CPX labs, please order future labs, Thanks , Terri       ------

## 2013-08-21 ENCOUNTER — Other Ambulatory Visit (INDEPENDENT_AMBULATORY_CARE_PROVIDER_SITE_OTHER): Payer: BC Managed Care – PPO

## 2013-08-21 DIAGNOSIS — Z0000 Encounter for general adult medical examination without abnormal findings: Secondary | ICD-10-CM

## 2013-08-21 LAB — COMPREHENSIVE METABOLIC PANEL
ALBUMIN: 4.3 g/dL (ref 3.5–5.2)
ALT: 16 U/L (ref 0–53)
AST: 20 U/L (ref 0–37)
Alkaline Phosphatase: 30 U/L — ABNORMAL LOW (ref 39–117)
BUN: 12 mg/dL (ref 6–23)
CALCIUM: 8.9 mg/dL (ref 8.4–10.5)
CHLORIDE: 109 meq/L (ref 96–112)
CO2: 27 mEq/L (ref 19–32)
Creatinine, Ser: 0.8 mg/dL (ref 0.4–1.5)
GFR: 116 mL/min (ref >60.00)
Glucose, Bld: 91 mg/dL (ref 70–99)
POTASSIUM: 4.1 meq/L (ref 3.5–5.1)
Sodium: 140 mEq/L (ref 135–145)
TOTAL PROTEIN: 7.1 g/dL (ref 6.0–8.3)
Total Bilirubin: 1.9 mg/dL — ABNORMAL HIGH (ref 0.2–1.2)

## 2013-08-21 LAB — LIPID PANEL
CHOL/HDL RATIO: 4
Cholesterol: 136 mg/dL (ref 0–200)
HDL: 31 mg/dL — ABNORMAL LOW (ref >39.00)
LDL Cholesterol: 88 mg/dL (ref 0–99)
NonHDL: 105
TRIGLYCERIDES: 84 mg/dL (ref 0.0–149.0)
VLDL: 16.8 mg/dL (ref 0.0–40.0)

## 2013-08-21 LAB — CBC WITH DIFFERENTIAL/PLATELET
Basophils Absolute: 0.1 10*3/uL (ref 0.0–0.1)
Basophils Relative: 1 % (ref 0.0–3.0)
EOS ABS: 0.1 10*3/uL (ref 0.0–0.7)
EOS PCT: 1.5 % (ref 0.0–5.0)
HCT: 42.7 % (ref 39.0–52.0)
Hemoglobin: 14.2 g/dL (ref 13.0–17.0)
LYMPHS PCT: 32.4 % (ref 12.0–46.0)
Lymphs Abs: 1.6 10*3/uL (ref 0.7–4.0)
MCHC: 33.2 g/dL (ref 30.0–36.0)
MCV: 90.2 fl (ref 78.0–100.0)
Monocytes Absolute: 0.4 10*3/uL (ref 0.1–1.0)
Monocytes Relative: 7.4 % (ref 3.0–12.0)
NEUTROS PCT: 57.7 % (ref 43.0–77.0)
Neutro Abs: 2.9 10*3/uL (ref 1.4–7.7)
Platelets: 287 10*3/uL (ref 150.0–400.0)
RBC: 4.73 Mil/uL (ref 4.22–5.81)
RDW: 13.3 % (ref 11.5–15.5)
WBC: 5 10*3/uL (ref 4.0–10.5)

## 2013-08-21 LAB — TSH: TSH: 3.49 u[IU]/mL (ref 0.35–4.50)

## 2013-08-25 ENCOUNTER — Encounter: Payer: Self-pay | Admitting: Family Medicine

## 2013-08-25 ENCOUNTER — Ambulatory Visit (INDEPENDENT_AMBULATORY_CARE_PROVIDER_SITE_OTHER): Payer: BC Managed Care – PPO | Admitting: Family Medicine

## 2013-08-25 VITALS — BP 114/68 | HR 73 | Temp 98.1°F | Ht 68.5 in | Wt 186.0 lb

## 2013-08-25 DIAGNOSIS — Z0000 Encounter for general adult medical examination without abnormal findings: Secondary | ICD-10-CM

## 2013-08-25 DIAGNOSIS — E786 Lipoprotein deficiency: Secondary | ICD-10-CM

## 2013-08-25 MED ORDER — OMEPRAZOLE 20 MG PO TBEC: DELAYED_RELEASE_TABLET | ORAL | Status: DC

## 2013-08-25 NOTE — Assessment & Plan Note (Signed)
Despite great exercise and habits- stable at 31 Other numbers great  Will try inc fish oil to 3000 mg daily as tol and follow

## 2013-08-25 NOTE — Progress Notes (Signed)
Subjective:    Patient ID: Darryl Jenkins, male    DOB: 1975-04-17, 38 y.o.   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 180 lb is his goal   He stopped drinking juice  Also sodas   Flu shot 10/14  colonosc 2007 - was primarily normal - did not get a recall  Td 3/11  Labs  Results for orders placed in visit on 08/21/13  CBC WITH DIFFERENTIAL      Result Value Ref Range   WBC 5.0  4.0 - 10.5 K/uL   RBC 4.73  4.22 - 5.81 Mil/uL   Hemoglobin 14.2  13.0 - 17.0 g/dL   HCT 40.9  81.1 - 91.4 %   MCV 90.2  78.0 - 100.0 fl   MCHC 33.2  30.0 - 36.0 g/dL   RDW 78.2  95.6 - 21.3 %   Platelets 287.0  150.0 - 400.0 K/uL   Neutrophils Relative % 57.7  43.0 - 77.0 %   Lymphocytes Relative 32.4  12.0 - 46.0 %   Monocytes Relative 7.4  3.0 - 12.0 %   Eosinophils Relative 1.5  0.0 - 5.0 %   Basophils Relative 1.0  0.0 - 3.0 %   Neutro Abs 2.9  1.4 - 7.7 K/uL   Lymphs Abs 1.6  0.7 - 4.0 K/uL   Monocytes Absolute 0.4  0.1 - 1.0 K/uL   Eosinophils Absolute 0.1  0.0 - 0.7 K/uL   Basophils Absolute 0.1  0.0 - 0.1 K/uL  COMPREHENSIVE METABOLIC PANEL      Result Value Ref Range   Sodium 140  135 - 145 mEq/L   Potassium 4.1  3.5 - 5.1 mEq/L   Chloride 109  96 - 112 mEq/L   CO2 27  19 - 32 mEq/L   Glucose, Bld 91  70 - 99 mg/dL   BUN 12  6 - 23 mg/dL   Creatinine, Ser 0.8  0.4 - 1.5 mg/dL   Total Bilirubin 1.9 (*) 0.2 - 1.2 mg/dL   Alkaline Phosphatase 30 (*) 39 - 117 U/L   AST 20  0 - 37 U/L   ALT 16  0 - 53 U/L   Total Protein 7.1  6.0 - 8.3 g/dL   Albumin 4.3  3.5 - 5.2 g/dL   Calcium 8.9  8.4 - 08.6 mg/dL   GFR 578.46  >96.29 mL/min  LIPID PANEL      Result Value Ref Range   Cholesterol 136  0 - 200 mg/dL   Triglycerides 52.8  0.0 - 149.0 mg/dL   HDL 41.32 (*) >44.01 mg/dL   VLDL 02.7  0.0 - 25.3 mg/dL   LDL Cholesterol 88  0 - 99 mg/dL   Total CHOL/HDL Ratio 4     NonHDL 105.00    TSH      Result Value Ref Range   TSH 3.49  0.35 - 4.50 uIU/mL    Bili level tends to go up and down  No abd symptoms   HDL does not go up - even with good exercise  Takes fish oil  Family hx of heart dz     Patient Active Problem  List   Diagnosis Date Noted  . Low HDL (under 40) 07/14/2010  . Routine general medical examination at a health care facility 07/10/2010  . GERD (gastroesophageal reflux disease) 03/27/2009   Past Medical History  Diagnosis Date  . Family history of ischemic heart disease   . Chest pain, unspecified   . Other malaise and fatigue   . Pneumonia   . Hemorrhoids    No past surgical history on file. History  Substance Use Topics  . Smoking status: Never Smoker   . Smokeless tobacco: Never Used  . Alcohol Use: Yes     Comment: rare   Family History  Problem Relation Age of Onset  . Hypertension Brother   . Hypertension Brother   . Breast cancer Mother    Allergies  Allergen Reactions  . Iohexol      Code: RASH, Desc: pt developed rash post 100 ml omni 350 for cta coronary exam. steroid taper prescribed after exam was complete. bsw 03/21/2009., Onset Date: [id number]    Current Outpatient Prescriptions on File Prior to Visit  Medication Sig Dispense Refill  . Multiple Vitamin (MULTIVITAMIN) capsule Take 1 capsule by mouth 2 (two) times daily.       . Omega-3 Fatty Acids (FISH OIL) 1000 MG CAPS Take 1 capsule by mouth 2 (two) times daily.       . Omeprazole 20 MG TBEC TAKE 1 TABLET BY MOUTH EVERY DAY  90 tablet  0  . vitamin C (ASCORBIC ACID) 500 MG tablet Take 500 mg by mouth daily.        . [DISCONTINUED] omeprazole (PRILOSEC OTC) 20 MG tablet Take 1 tablet (20 mg total) by mouth every morning.  30 tablet  11   No current facility-administered medications on file  prior to visit.    Review of Systems    Review of Systems  Constitutional: Negative for fever, appetite change, fatigue and unexpected weight change.  Eyes: Negative for pain and visual disturbance.  Respiratory: Negative for cough and shortness of breath.   Cardiovascular: Negative for cp or palpitations    Gastrointestinal: Negative for nausea, diarrhea and constipation.  Genitourinary: Negative for urgency and frequency.  Skin: Negative for pallor or rash   Neurological: Negative for weakness, light-headedness, numbness and headaches.  Hematological: Negative for adenopathy. Does not bruise/bleed easily.  Psychiatric/Behavioral: Negative for dysphoric mood. The patient is not nervous/anxious.      Objective:   Physical Exam  Constitutional: He appears well-developed and well-nourished. No distress.  HENT:  Head: Normocephalic and atraumatic.  Right Ear: External ear normal.  Left Ear: External ear normal.  Nose: Nose normal.  Mouth/Throat: Oropharynx is clear and moist.  Eyes: Conjunctivae and EOM are normal. Pupils are equal, round, and reactive to light. Right eye exhibits no discharge. Left eye exhibits no discharge. No scleral icterus.  Neck: Normal range of motion. Neck supple. No JVD present. Carotid bruit is not present. No thyromegaly present.  Cardiovascular: Normal rate, regular rhythm, normal heart sounds and intact distal pulses.  Exam reveals no gallop.   Pulmonary/Chest: Effort normal and breath sounds normal. No respiratory distress. He has no wheezes. He exhibits no tenderness.  Abdominal: Soft. Bowel sounds are normal. He exhibits no distension, no abdominal bruit and no mass. There is no tenderness.  Genitourinary:  GU exam deferred   Musculoskeletal: He exhibits no edema and no tenderness.  Lymphadenopathy:    He has no cervical adenopathy.  Neurological: He is alert. He has  normal reflexes. No cranial nerve deficit. He exhibits normal muscle tone.  Coordination normal.  Skin: Skin is warm and dry. No rash noted. No erythema. No pallor.  Solar lentigos noted   Psychiatric: He has a normal mood and affect.          Assessment & Plan:   Problem List Items Addressed This Visit     Other   Routine general medical examination at a health care facility - Primary     Commended on great habits and wt loss Reviewed health habits including diet and exercise and skin cancer prevention Reviewed appropriate screening tests for age  Also reviewed health mt list, fam hx and immunization status , as well as social and family history   Labs reviewed     Low HDL (under 40)     Despite great exercise and habits- stable at 31 Other numbers great  Will try inc fish oil to 3000 mg daily as tol and follow

## 2013-08-25 NOTE — Patient Instructions (Signed)
Take care of yourself  Great job with weight loss and good health habits  You can increase fish oil to 3000 mg if you tolerate it (divide it twice daily)

## 2013-08-25 NOTE — Progress Notes (Signed)
Pre visit review using our clinic review tool, if applicable. No additional management support is needed unless otherwise documented below in the visit note. 

## 2013-08-25 NOTE — Assessment & Plan Note (Signed)
Commended on great habits and wt loss Reviewed health habits including diet and exercise and skin cancer prevention Reviewed appropriate screening tests for age  Also reviewed health mt list, fam hx and immunization status , as well as social and family history   Labs reviewed

## 2014-02-19 ENCOUNTER — Ambulatory Visit (INDEPENDENT_AMBULATORY_CARE_PROVIDER_SITE_OTHER): Payer: BC Managed Care – PPO | Admitting: Family Medicine

## 2014-02-19 ENCOUNTER — Encounter: Payer: Self-pay | Admitting: Family Medicine

## 2014-02-19 VITALS — BP 134/94 | HR 66 | Temp 98.2°F | Ht 68.5 in | Wt 200.2 lb

## 2014-02-19 DIAGNOSIS — B9789 Other viral agents as the cause of diseases classified elsewhere: Principal | ICD-10-CM

## 2014-02-19 DIAGNOSIS — J069 Acute upper respiratory infection, unspecified: Secondary | ICD-10-CM

## 2014-02-19 MED ORDER — AZITHROMYCIN 250 MG PO TABS: ORAL_TABLET | ORAL | Status: DC

## 2014-02-19 MED ORDER — BENZONATATE 200 MG PO CAPS: 200.0000 mg | ORAL_CAPSULE | Freq: Three times a day (TID) | ORAL | Status: DC | PRN

## 2014-02-19 MED ORDER — HYDROCODONE-HOMATROPINE 5-1.5 MG/5ML PO SYRP: 5.0000 mL | ORAL_SOLUTION | Freq: Three times a day (TID) | ORAL | Status: DC | PRN

## 2014-02-19 NOTE — Progress Notes (Signed)
Subjective:    Patient ID: Darryl Jenkins, male    DOB: 07-Dec-1975, 39 y.o.   MRN: [id number]  HPI Here with uri symptoms For about a week  Body aches  Out of work several days   Nasal congestion  St  Now bad cough -(though he was starting to feel a bit better) Yesterday- was productive of green sputum  Has not lost his voice No fever   Took children's cold and cough med at 3 am  Very tired   Patient Active Problem List   Diagnosis Date Noted  . Low HDL (under 40) 07/14/2010  . Routine general medical examination at a health care facility 07/10/2010  . GERD (gastroesophageal reflux disease) 03/27/2009   Past Medical History  Diagnosis Date  . Family history of ischemic heart disease   . Chest pain, unspecified   . Other malaise and fatigue   . Pneumonia   . Hemorrhoids    No past surgical history on file. History  Substance Use Topics  . Smoking status: Never Smoker   . Smokeless tobacco: Never Used  . Alcohol Use: 0.0 oz/week    0 Not specified per week     Comment: rare   Family History  Problem Relation Age of Onset  . Hypertension Brother   . Hypertension Brother   . Breast cancer Mother    Allergies  Allergen Reactions  . Iohexol      Code: RASH, Desc: pt developed rash post 100 ml omni 350 for cta coronary exam. steroid taper prescribed after exam was complete. bsw 03/21/2009., Onset Date: [id number]    Current Outpatient Prescriptions on File Prior to Visit  Medication Sig Dispense Refill  . Multiple Vitamin (MULTIVITAMIN) capsule Take 1 capsule by mouth 2 (two) times daily.     . Omega-3 Fatty Acids (FISH OIL) 1000 MG CAPS Take 1 capsule by mouth 2 (two) times daily.     . Omeprazole 20 MG TBEC TAKE 1 TABLET BY MOUTH EVERY DAY 90 tablet 3  . vitamin C (ASCORBIC ACID) 500 MG tablet Take 500 mg by mouth daily.      . [DISCONTINUED] omeprazole (PRILOSEC OTC) 20 MG tablet Take 1 tablet (20 mg total) by mouth every morning. 30 tablet 11   No current  facility-administered medications on file prior to visit.     Review of Systems Review of Systems  Constitutional: Negative for fever, appetite change,  and unexpected weight change. pos for malaise ENT pos for congestion and rhinorrhea and neg for sinus pain  Eyes: Negative for pain and visual disturbance.  Respiratory: Negative for wheeze  and shortness of breath.   Cardiovascular: Negative for cp or palpitations    Gastrointestinal: Negative for nausea, diarrhea and constipation.  Genitourinary: Negative for urgency and frequency.  Skin: Negative for pallor or rash   Neurological: Negative for weakness, light-headedness, numbness and headaches.  Hematological: Negative for adenopathy. Does not bruise/bleed easily.  Psychiatric/Behavioral: Negative for dysphoric mood. The patient is not nervous/anxious.         Objective:   Physical Exam  Constitutional: He appears well-developed and well-nourished.  HENT:  Head: Normocephalic and atraumatic.  Right Ear: External ear normal.  Left Ear: External ear normal.  Mouth/Throat: Oropharynx is clear and moist. No oropharyngeal exudate.  Nares are injected and congested  Clear rhinorrhea  No sinus tenderness   Eyes: Conjunctivae and EOM are normal. Pupils are equal, round, and reactive to light.  Neck: Normal range of  motion. Neck supple.  Cardiovascular: Normal rate and regular rhythm.   Pulmonary/Chest: Effort normal and breath sounds normal. No respiratory distress. He has no wheezes. He has no rales.  No rales or rhonchi  Lymphadenopathy:    He has no cervical adenopathy.  Neurological: He is alert.  Skin: Skin is warm and dry. No rash noted. No erythema. No pallor.  Psychiatric: He has a normal mood and affect.          Assessment & Plan:   Problem List Items Addressed This Visit      Respiratory   Viral URI with cough - Primary    Re assuring exam  Disc symptomatic care - see instructions on AVS  Tessalon for cough  tid prn  Hycodan with caution for pm  Fluids/rest   Will hold px for zpak and take if symptoms worse/ update as well     Relevant Medications      azithromycin (ZITHROMAX) tablet

## 2014-02-19 NOTE — Progress Notes (Signed)
Pre visit review using our clinic review tool, if applicable. No additional management support is needed unless otherwise documented below in the visit note. 

## 2014-02-19 NOTE — Patient Instructions (Signed)
Use tessalon for cough three times per day  Hycodan at night with caution of sedation Fluids and rest If worse or improving in the next week fill the px for zpak

## 2014-02-19 NOTE — Assessment & Plan Note (Signed)
Re assuring exam  Disc symptomatic care - see instructions on AVS  Tessalon for cough tid prn  Hycodan with caution for pm  Fluids/rest   Will hold px for zpak and take if symptoms worse/ update as well

## 2014-02-21 ENCOUNTER — Encounter: Payer: Self-pay | Admitting: Family Medicine

## 2014-09-11 ENCOUNTER — Telehealth: Payer: Self-pay | Admitting: Family Medicine

## 2014-09-11 DIAGNOSIS — Z0000 Encounter for general adult medical examination without abnormal findings: Secondary | ICD-10-CM

## 2014-09-11 NOTE — Telephone Encounter (Signed)
-----   Message from Alvina Chou sent at 09/05/2014  6:17 PM EDT ----- Regarding: Lab orders for Wednesday, 8.3.16 Patient is scheduled for CPX labs, please order future labs, Thanks , Camelia Eng

## 2014-09-12 ENCOUNTER — Other Ambulatory Visit (INDEPENDENT_AMBULATORY_CARE_PROVIDER_SITE_OTHER): Payer: BC Managed Care – PPO

## 2014-09-12 DIAGNOSIS — Z0000 Encounter for general adult medical examination without abnormal findings: Secondary | ICD-10-CM | POA: Diagnosis not present

## 2014-09-12 LAB — COMPREHENSIVE METABOLIC PANEL
ALBUMIN: 4.5 g/dL (ref 3.5–5.2)
ALT: 22 U/L (ref 0–53)
AST: 22 U/L (ref 0–37)
Alkaline Phosphatase: 27 U/L — ABNORMAL LOW (ref 39–117)
BUN: 11 mg/dL (ref 6–23)
CHLORIDE: 105 meq/L (ref 96–112)
CO2: 28 mEq/L (ref 19–32)
Calcium: 9.6 mg/dL (ref 8.4–10.5)
Creatinine, Ser: 0.93 mg/dL (ref 0.40–1.50)
GFR: 95.57 mL/min (ref >60.00)
Glucose, Bld: 102 mg/dL — ABNORMAL HIGH (ref 70–99)
Potassium: 4.1 mEq/L (ref 3.5–5.1)
Sodium: 140 mEq/L (ref 135–145)
TOTAL PROTEIN: 7 g/dL (ref 6.0–8.3)
Total Bilirubin: 1.1 mg/dL (ref 0.2–1.2)

## 2014-09-12 LAB — TSH: TSH: 2.25 u[IU]/mL (ref 0.35–4.50)

## 2014-09-12 LAB — CBC WITH DIFFERENTIAL/PLATELET
BASOS ABS: 0 10*3/uL (ref 0.0–0.1)
BASOS PCT: 0.6 % (ref 0.0–3.0)
Eosinophils Absolute: 0.1 10*3/uL (ref 0.0–0.7)
Eosinophils Relative: 1.7 % (ref 0.0–5.0)
HEMATOCRIT: 44.3 % (ref 39.0–52.0)
HEMOGLOBIN: 15.1 g/dL (ref 13.0–17.0)
LYMPHS ABS: 1.7 10*3/uL (ref 0.7–4.0)
LYMPHS PCT: 32.3 % (ref 12.0–46.0)
MCHC: 34.2 g/dL (ref 30.0–36.0)
MCV: 89.6 fl (ref 78.0–100.0)
Monocytes Absolute: 0.4 10*3/uL (ref 0.1–1.0)
Monocytes Relative: 8 % (ref 3.0–12.0)
NEUTROS PCT: 57.4 % (ref 43.0–77.0)
Neutro Abs: 3.1 10*3/uL (ref 1.4–7.7)
Platelets: 313 10*3/uL (ref 150.0–400.0)
RBC: 4.94 Mil/uL (ref 4.22–5.81)
RDW: 12.9 % (ref 11.5–15.5)
WBC: 5.4 10*3/uL (ref 4.0–10.5)

## 2014-09-12 LAB — LIPID PANEL
CHOLESTEROL: 142 mg/dL (ref 0–200)
HDL: 31.2 mg/dL — ABNORMAL LOW (ref >39.00)
LDL Cholesterol: 94 mg/dL (ref 0–99)
NonHDL: 110.53
Total CHOL/HDL Ratio: 5
Triglycerides: 84 mg/dL (ref 0.0–149.0)
VLDL: 16.8 mg/dL (ref 0.0–40.0)

## 2014-09-14 ENCOUNTER — Ambulatory Visit (INDEPENDENT_AMBULATORY_CARE_PROVIDER_SITE_OTHER): Payer: BC Managed Care – PPO | Admitting: Family Medicine

## 2014-09-14 ENCOUNTER — Encounter: Payer: Self-pay | Admitting: Family Medicine

## 2014-09-14 VITALS — BP 118/82 | HR 67 | Temp 98.2°F | Ht 68.75 in | Wt 198.0 lb

## 2014-09-14 DIAGNOSIS — E786 Lipoprotein deficiency: Secondary | ICD-10-CM

## 2014-09-14 DIAGNOSIS — Z0000 Encounter for general adult medical examination without abnormal findings: Secondary | ICD-10-CM

## 2014-09-14 DIAGNOSIS — K219 Gastro-esophageal reflux disease without esophagitis: Secondary | ICD-10-CM

## 2014-09-14 MED ORDER — OMEPRAZOLE 20 MG PO TBEC: DELAYED_RELEASE_TABLET | ORAL | Status: DC

## 2014-09-14 NOTE — Progress Notes (Signed)
Subjective:    Patient ID: Darryl Jenkins, male    DOB: [birth year], 39 y.o.   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   Eating fair -not as good as he should - a lot of sporting events  A lot of hot dogs etc   HIV screening- not high risk /declines   Flu shot - got it in oct of last year   Colonoscopy 2007 - did not give a recall  Thinks it was clear  Did it for dx (blood in stool)   Td 3/11   Hx of low HDL Lab Results  Component Value Date   CHOL 142 09/12/2014   CHOL 136 08/21/2013   CHOL 138 08/17/2012   Lab Results  Component Value Date   HDL 31.20* 09/12/2014   HDL 31.00* 08/21/2013   HDL 31.80* 08/17/2012   Lab Results  Component Value Date   LDLCALC 94 09/12/2014   LDLCALC 88 08/21/2013   LDLCALC 94 08/17/2012   Lab Results  Component Value Date   TRIG 84.0 09/12/2014   TRIG 84.0 08/21/2013   TRIG 59.0 08/17/2012   Lab Results  Component Value Date   CHOLHDL 5 09/12/2014   CHOLHDL 4 08/21/2013   CHOLHDL 4 08/17/2012   No results found for: LDLDIRECT Overall his HDL remains low - he walks and takes fish oil   Results for orders placed or performed in visit on 09/12/14  CBC with Differential/Platelet  Result Value Ref Range   WBC 5.4 4.0 - 10.5 K/uL   RBC 4.94 4.22 - 5.81 Mil/uL   Hemoglobin 15.1 13.0 - 17.0 g/dL   HCT 86.5 78.4 - 69.6 %   MCV 89.6 78.0 - 100.0 fl   MCHC 34.2 30.0 - 36.0 g/dL   RDW 29.5 28.4 - 13.2 %   Platelets 313.0 150.0 - 400.0 K/uL   Neutrophils Relative % 57.4 43.0 - 77.0 %   Lymphocytes Relative 32.3 12.0 - 46.0 %   Monocytes Relative 8.0 3.0 - 12.0 %   Eosinophils Relative 1.7 0.0 - 5.0 %   Basophils Relative 0.6 0.0 - 3.0 %   Neutro Abs 3.1 1.4 - 7.7 K/uL   Lymphs Abs 1.7 0.7 - 4.0 K/uL   Monocytes  Absolute 0.4 0.1 - 1.0 K/uL   Eosinophils Absolute 0.1 0.0 - 0.7 K/uL   Basophils Absolute 0.0 0.0 - 0.1 K/uL  Comprehensive metabolic panel  Result Value Ref Range   Sodium 140 135 - 145 mEq/L   Potassium 4.1 3.5 - 5.1 mEq/L   Chloride 105 96 - 112 mEq/L   CO2 28 19 - 32 mEq/L   Glucose, Bld 102 (H) 70 - 99 mg/dL   BUN 11 6 - 23 mg/dL   Creatinine, Ser 4.40 0.40 - 1.50 mg/dL   Total Bilirubin 1.1 0.2 - 1.2 mg/dL   Alkaline Phosphatase 27 (L) 39 - 117 U/L   AST 22 0 - 37 U/L   ALT 22 0 - 53 U/L   Total Protein 7.0 6.0 - 8.3 g/dL   Albumin 4.5 3.5 - 5.2 g/dL   Calcium 9.6 8.4 - 10.2 mg/dL   GFR 72.53 >66.44 mL/min  Lipid panel  Result Value Ref Range  Cholesterol 142 0 - 200 mg/dL   Triglycerides 98.1 0.0 - 149.0 mg/dL   HDL 19.14 (L) >78.29 mg/dL   VLDL 56.2 0.0 - 13.0 mg/dL   LDL Cholesterol 94 0 - 99 mg/dL   Total CHOL/HDL Ratio 5    NonHDL 110.53   TSH  Result Value Ref Range   TSH 2.25 0.35 - 4.50 uIU/mL    No prostate problems  Nocturia is once generally - in the past 2 years - stable  No prostate cancer in the family   Patient Active Problem List   Diagnosis Date Noted  . Viral URI with cough 02/19/2014  . Low HDL (under 40) 07/14/2010  . Routine general medical examination at a health care facility 07/10/2010  . GERD (gastroesophageal reflux disease) 03/27/2009   Past Medical History  Diagnosis Date  . Family history of ischemic heart disease   . Chest pain, unspecified   . Other malaise and fatigue   . Pneumonia   . Hemorrhoids    No past surgical history on file. History  Substance Use Topics  . Smoking status: Never Smoker   . Smokeless tobacco: Never Used  . Alcohol Use: 0.0 oz/week    0 Standard drinks or equivalent per week     Comment: rare   Family History  Problem Relation Age of Onset  . Hypertension Brother   . Hypertension Brother   . Breast cancer Mother    Allergies  Allergen Reactions  . Iohexol      Code: RASH, Desc: pt  developed rash post 100 ml omni 350 for cta coronary exam. steroid taper prescribed after exam was complete. bsw 03/21/2009., Onset Date: [id number]    Current Outpatient Prescriptions on File Prior to Visit  Medication Sig Dispense Refill  . Multiple Vitamin (MULTIVITAMIN) capsule Take 1 capsule by mouth 2 (two) times daily.     . Omega-3 Fatty Acids (FISH OIL) 1000 MG CAPS Take 1 capsule by mouth 2 (two) times daily.     . Omeprazole 20 MG TBEC TAKE 1 TABLET BY MOUTH EVERY DAY 90 tablet 3  . vitamin C (ASCORBIC ACID) 500 MG tablet Take 500 mg by mouth daily.      . [DISCONTINUED] omeprazole (PRILOSEC OTC) 20 MG tablet Take 1 tablet (20 mg total) by mouth every morning. 30 tablet 11   No current facility-administered medications on file prior to visit.     Review of Systems Review of Systems  Constitutional: Negative for fever, appetite change, fatigue and unexpected weight change.  Eyes: Negative for pain and visual disturbance.  Respiratory: Negative for cough and shortness of breath.   Cardiovascular: Negative for cp or palpitations    Gastrointestinal: Negative for nausea, diarrhea and constipation. pos for heartburn if he does not take his PPI  Genitourinary: Negative for urgency and frequency.  Skin: Negative for pallor or rash   Neurological: Negative for weakness, light-headedness, numbness and headaches.  Hematological: Negative for adenopathy. Does not bruise/bleed easily.  Psychiatric/Behavioral: Negative for dysphoric mood. The patient is not nervous/anxious.         Objective:   Physical Exam  Constitutional: He appears well-developed and well-nourished. No distress.  overwt and well appearing   HENT:  Head: Normocephalic and atraumatic.  Right Ear: External ear normal.  Left Ear: External ear normal.  Nose: Nose normal.  Mouth/Throat: Oropharynx is clear and moist.  Eyes: Conjunctivae and EOM are normal. Pupils are equal, round, and reactive to light. Right eye  exhibits no discharge. Left eye exhibits no discharge. No scleral icterus.  Neck: Normal range of motion. Neck supple. No JVD present. Carotid bruit is not present. No thyromegaly present.  Cardiovascular: Normal rate, regular rhythm, normal heart sounds and intact distal pulses.  Exam reveals no gallop.   Pulmonary/Chest: Effort normal and breath sounds normal. No respiratory distress. He has no wheezes. He exhibits no tenderness.  Abdominal: Soft. Bowel sounds are normal. He exhibits no distension, no abdominal bruit and no mass. There is no tenderness.  Musculoskeletal: He exhibits no edema or tenderness.  Lymphadenopathy:    He has no cervical adenopathy.  Neurological: He is alert. He has normal reflexes. No cranial nerve deficit. He exhibits normal muscle tone. Coordination normal.  Skin: Skin is warm and dry. No rash noted. No erythema. No pallor.  Solar lentigo diffusely  Psychiatric: He has a normal mood and affect.          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

## 2014-09-14 NOTE — Progress Notes (Signed)
Pre visit review using our clinic review tool, if applicable. No additional management support is needed unless otherwise documented below in the visit note. 

## 2014-09-14 NOTE — Patient Instructions (Signed)
If you want to cut your acid reflux medicine in 1/2 or take it every other day-that is fine - to play with- but if symptoms return significantly you need to stay on it  Keep working on healthy balanced diet  Also exercise Avoid medicines that cause acid symptoms  Get your flu shot in the fall    Labs are stable

## 2014-09-16 NOTE — Assessment & Plan Note (Signed)
Pt has tried to stop PPI-concern over long term side effects-but gets rebound symptoms quickly May try QOD dosing to see how it goes-will update  Disc diet for gerd

## 2014-09-16 NOTE — Assessment & Plan Note (Signed)
Disc goals for lipids and reasons to control them Rev labs with pt Rev low sat fat diet in detail Disc exercise and omega 3 supplementation to help HDL

## 2014-09-16 NOTE — Assessment & Plan Note (Signed)
Reviewed health habits including diet and exercise and skin cancer prevention Reviewed appropriate screening tests for age  Also reviewed health mt list, fam hx and immunization status , as well as social and family history   See HPI Labs reviewed  Pt plans to get flu shot in the fall Enc a regular exercise program

## 2014-09-18 ENCOUNTER — Other Ambulatory Visit: Payer: BC Managed Care – PPO

## 2014-11-18 ENCOUNTER — Other Ambulatory Visit: Payer: Self-pay | Admitting: Family Medicine

## 2015-03-04 ENCOUNTER — Encounter: Payer: Self-pay | Admitting: Family Medicine

## 2015-03-06 ENCOUNTER — Ambulatory Visit (INDEPENDENT_AMBULATORY_CARE_PROVIDER_SITE_OTHER): Payer: BC Managed Care – PPO | Admitting: Family Medicine

## 2015-03-06 ENCOUNTER — Encounter: Payer: Self-pay | Admitting: Family Medicine

## 2015-03-06 VITALS — BP 120/86 | HR 69 | Temp 98.3°F | Ht 68.75 in | Wt 202.2 lb

## 2015-03-06 DIAGNOSIS — M545 Low back pain, unspecified: Secondary | ICD-10-CM

## 2015-03-06 NOTE — Progress Notes (Signed)
Dr. Karleen Hampshire T. Corban Kistler, MD, CAQ Sports [facility] Sports [facility] [address] [place], [address] Phone: [phone] Fax: [phone]  03/06/2015  Patient: Darryl Jenkins, MRN: [id number], DOB: 1975-07-28, 40 y.o.  Primary Physician:  Darryl Manns, MD   Chief Complaint  Patient presents with  . Back Pain    Low back mostly but some mid back also   Subjective:   Darryl Jenkins is a 40 y.o. very pleasant male patient who presents with the following: Back Pain  ongoing for approximately: several years The patient has had back pain before. The back pain is localized into the lumbar spine area. They also describe no radiculopathy.  Lower back is bothering him. Has been off and on for a couple of years. Now impacting the way that he will sit. Mostly on the left side.   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  No numbness or tingling. No bowel or bladder incontinence. No focal weakness. Prior interventions: OTC NSAIDS or tylenol Physical therapy: No Chiropractic manipulations: No Acupuncture: No Osteopathic manipulation: No Heat or cold: Minimal effect  Past Medical History, Surgical History, Family History, Medications, Allergies have been reviewed and updated if relevant.  Patient Active Problem List   Diagnosis Date Noted  . Low HDL (under 40) 07/14/2010  . Routine general medical examination at a health care facility 07/10/2010  . GERD (gastroesophageal reflux disease) 03/27/2009    Past Medical History  Diagnosis Date  . Family history of ischemic heart disease   . Chest pain, unspecified   . Other malaise and fatigue   . Pneumonia   . Hemorrhoids     No past surgical history on file.  Social History   Social History  . Marital Status: Married    Spouse Name: N/A  . Number of Children: N/A  . Years of Education: N/A   Occupational History  . Not on file.   Social History  Main Topics  . Smoking status: Never Smoker   . Smokeless tobacco: Never Used  . Alcohol Use: 0.0 oz/week    0 Standard drinks or equivalent per week     Comment: rare  . Drug Use: No  . Sexual Activity: Not on file   Other Topics Concern  . Not on file   Social History Narrative    Family History  Problem Relation Age of Onset  . Hypertension Brother   . Hypertension Brother   . Breast cancer Mother     Allergies  Allergen Reactions  . Iohexol      Code: RASH, Desc: pt developed rash post 100 ml omni 350 for cta coronary exam. steroid taper prescribed after exam was complete. bsw 03/21/2009., Onset Date: [id number]     Medication list reviewed and updated in full in [facility].  GEN: No fevers, chills. Nontoxic. Primarily MSK c/o today. MSK: Detailed in the HPI GI: tolerating PO intake without difficulty Neuro: As above  Otherwise the pertinent positives of the ROS are noted above.    Objective:   Blood pressure 120/86, pulse 69, temperature 98.3 F (36.8 C), temperature source Oral, height 5' 8.75" (1.746 m), weight 202 lb 4 oz (91.74 kg).  Gen: Well-developed,well-nourished,in no acute distress; alert,appropriate and cooperative throughout examination HEENT: Normocephalic and atraumatic without obvious abnormalities.  Ears, externally no deformities Pulm: Breathing comfortably in no respiratory distress Range of motion at  the waist:  Flexion, rotation and lateral bending: full  No echymosis or edema Rises to examination table with no difficulty Gait: minimally antalgic  Inspection/Deformity: No abnormality Paraspinus T:  Mild tenderness L4-5, B  B Ankle Dorsiflexion (L5,4): 5/5 B Great Toe Dorsiflexion (L5,4): 5/5 Heel Walk (L5): WNL Toe Walk (S1): WNL Rise/Squat (L4): WNL, mild pain  SENSORY B Medial Foot (L4): WNL B Dorsum (L5): WNL B Lateral (S1): WNL Light Touch: WNL Pinprick: WNL  REFLEXES Knee (L4): 2+ Ankle (S1): 2+  B SLR, seated:  neg B SLR, supine: neg B FABER: neg B Reverse FABER: neg B Greater Troch: NT B Log Roll: neg B Stork: NT B Sciatic Notch: NT  HIP EXAM: SIDE: B ROM: Abduction, Flexion, Internal and External range of motion: relatively poor overall for age with notable decrease in internal range of motion. Pain with terminal IROM and EROM: minimal GTB: NT SLR: NEG Knees: No effusion FABER: NT REVERSE FABER: NT, neg Piriformis: NT at direct palpation Str: flexion: 5/5 abduction: 5/5 adduction: 5/5 Strength testing non-tender  Radiology: No results found.  Assessment and Plan:   Bilateral low back pain without sciatica - Plan: Ambulatory referral to Physical Therapy  Currently not exercising at all.  Relatively poor hip range of motion as well as hamstring range of motion.  I think some good dedicated physical therapy and routine core work will likely help the patient.  Anticipate that lifelong physical activity as well as core maintenance would be of benefit.  Anatomy reviewed. Conservative algorithms for acute back pain generally begin with the following: NSAIDS, Muscle Relaxants, Mild pain medication  Start with medications, core rehab, and progress from there following low back pain algorithm. No red flags are present.  Follow-up: prn or if no improvement  Orders Placed This Encounter  Procedures  . Ambulatory referral to Physical Therapy    Signed,  Darryl Hampshire T. Darryl Ellenwood, MD   Patient's Medications  New Prescriptions   No medications on file  Previous Medications   MULTIPLE VITAMIN (MULTIVITAMIN) CAPSULE    Take 1 capsule by mouth 2 (two) times daily.    OMEGA-3 FATTY ACIDS (FISH OIL) 1000 MG CAPS    Take 1 capsule by mouth 2 (two) times daily.    OMEPRAZOLE 20 MG TBEC    TAKE 1 TABLET BY MOUTH EVERY DAY   VITAMIN C (ASCORBIC ACID) 500 MG TABLET    Take 500 mg by mouth daily.    Modified Medications   No medications on file  Discontinued Medications   No medications on file

## 2015-03-06 NOTE — Patient Instructions (Signed)

## 2015-03-06 NOTE — Progress Notes (Signed)
Pre visit review using our clinic review tool, if applicable. No additional management support is needed unless otherwise documented below in the visit note. 

## 2015-09-08 ENCOUNTER — Telehealth: Payer: Self-pay | Admitting: Family Medicine

## 2015-09-08 DIAGNOSIS — Z0000 Encounter for general adult medical examination without abnormal findings: Secondary | ICD-10-CM

## 2015-09-08 NOTE — Telephone Encounter (Signed)
-----   Message from Baldomero Lamy sent at 09/03/2015  1:24 PM EDT ----- Regarding: Cpx labs Wed 8/2, need orders. Thanks! :-) Please order  future cpx labs for pt's upcoming lab appt. Thanks Rodney Booze

## 2015-09-08 NOTE — Telephone Encounter (Signed)
-----   Message from Natasha C Chavers sent at 09/03/2015  1:24 PM EDT ----- Regarding: Cpx labs Wed 8/2, need orders. Thanks! :-) Please order  future cpx labs for pt's upcoming lab appt. Thanks Tasha  

## 2015-09-11 ENCOUNTER — Other Ambulatory Visit (INDEPENDENT_AMBULATORY_CARE_PROVIDER_SITE_OTHER): Payer: BC Managed Care – PPO

## 2015-09-11 DIAGNOSIS — Z0000 Encounter for general adult medical examination without abnormal findings: Secondary | ICD-10-CM

## 2015-09-11 LAB — CBC WITH DIFFERENTIAL/PLATELET
BASOS ABS: 0 10*3/uL (ref 0.0–0.1)
Basophils Relative: 0.7 % (ref 0.0–3.0)
EOS ABS: 0.1 10*3/uL (ref 0.0–0.7)
Eosinophils Relative: 2.6 % (ref 0.0–5.0)
HEMATOCRIT: 42.6 % (ref 39.0–52.0)
HEMOGLOBIN: 14.4 g/dL (ref 13.0–17.0)
LYMPHS ABS: 1.8 10*3/uL (ref 0.7–4.0)
Lymphocytes Relative: 35 % (ref 12.0–46.0)
MCHC: 33.9 g/dL (ref 30.0–36.0)
MCV: 88.3 fl (ref 78.0–100.0)
Monocytes Absolute: 0.3 10*3/uL (ref 0.1–1.0)
Monocytes Relative: 6.6 % (ref 3.0–12.0)
Neutro Abs: 2.9 10*3/uL (ref 1.4–7.7)
Neutrophils Relative %: 55.1 % (ref 43.0–77.0)
Platelets: 337 10*3/uL (ref 150.0–400.0)
RBC: 4.83 Mil/uL (ref 4.22–5.81)
RDW: 12.7 % (ref 11.5–15.5)
WBC: 5.3 10*3/uL (ref 4.0–10.5)

## 2015-09-11 LAB — COMPREHENSIVE METABOLIC PANEL
ALBUMIN: 4.5 g/dL (ref 3.5–5.2)
ALK PHOS: 34 U/L — AB (ref 39–117)
ALT: 27 U/L (ref 0–53)
AST: 20 U/L (ref 0–37)
BILIRUBIN TOTAL: 0.7 mg/dL (ref 0.2–1.2)
BUN: 12 mg/dL (ref 6–23)
CALCIUM: 9.6 mg/dL (ref 8.4–10.5)
CO2: 26 meq/L (ref 19–32)
CREATININE: 0.82 mg/dL (ref 0.40–1.50)
Chloride: 104 mEq/L (ref 96–112)
GFR: 109.97 mL/min (ref >60.00)
Glucose, Bld: 106 mg/dL — ABNORMAL HIGH (ref 70–99)
Potassium: 4.4 mEq/L (ref 3.5–5.1)
Sodium: 139 mEq/L (ref 135–145)
TOTAL PROTEIN: 7.2 g/dL (ref 6.0–8.3)

## 2015-09-11 LAB — LIPID PANEL
CHOL/HDL RATIO: 5
CHOLESTEROL: 145 mg/dL (ref 0–200)
HDL: 30.3 mg/dL — ABNORMAL LOW (ref >39.00)
LDL Cholesterol: 97 mg/dL (ref 0–99)
NonHDL: 114.56
TRIGLYCERIDES: 90 mg/dL (ref 0.0–149.0)
VLDL: 18 mg/dL (ref 0.0–40.0)

## 2015-09-11 LAB — TSH: TSH: 1.61 u[IU]/mL (ref 0.35–4.50)

## 2015-09-16 ENCOUNTER — Ambulatory Visit (INDEPENDENT_AMBULATORY_CARE_PROVIDER_SITE_OTHER): Payer: BC Managed Care – PPO | Admitting: Family Medicine

## 2015-09-16 ENCOUNTER — Encounter: Payer: Self-pay | Admitting: Family Medicine

## 2015-09-16 VITALS — BP 124/86 | HR 61 | Temp 97.9°F | Ht 68.5 in | Wt 194.8 lb

## 2015-09-16 DIAGNOSIS — E786 Lipoprotein deficiency: Secondary | ICD-10-CM | POA: Diagnosis not present

## 2015-09-16 DIAGNOSIS — K219 Gastro-esophageal reflux disease without esophagitis: Secondary | ICD-10-CM | POA: Diagnosis not present

## 2015-09-16 DIAGNOSIS — Z0000 Encounter for general adult medical examination without abnormal findings: Secondary | ICD-10-CM

## 2015-09-16 DIAGNOSIS — R7303 Prediabetes: Secondary | ICD-10-CM | POA: Insufficient documentation

## 2015-09-16 DIAGNOSIS — R739 Hyperglycemia, unspecified: Secondary | ICD-10-CM

## 2015-09-16 NOTE — Assessment & Plan Note (Signed)
Fasting glucose 106 Disc low glycemic diet and exercise Continue to follow

## 2015-09-16 NOTE — Assessment & Plan Note (Signed)
Overall stable  Pt will continue fish oil and exercise

## 2015-09-16 NOTE — Patient Instructions (Signed)
Do try to stay away from sweets as much as you can  Control GERD with diet  Exercise/ stay active  We will watch blood sugar levels

## 2015-09-16 NOTE — Assessment & Plan Note (Signed)
Off omeprazole now - doing ok  Watches diet to control it

## 2015-09-16 NOTE — Progress Notes (Signed)
Subjective:    Patient ID: Darryl Jenkins, male    DOB: 1975-02-23, 40 y.o.   MRN: [id number]  HPI Here for health maintenance exam and to review chronic medical problems    Doing well overall  Working and children  Family is at the beach this week    HIV screening -not interested /not high risk   Had fu shot last fall Pneumonia vaccine 3/11  Colonoscopy 2007 - for symptoms- thinks it was normal / poss polyps  He does not remember a call back  It was within [place]  Td 3/11  Wt Readings from Last 3 Encounters:  09/16/15 194 lb 12 oz (88.3 kg)  03/06/15 202 lb 4 oz (91.7 kg)  09/14/14 198 lb (89.8 kg)  he is working on weight loss - trying to get off sweets (hard when they are in the house with children  Still plays softball through the year / and also active with kids during sport season  bmi is 29.18  Hx of low HDL Lab Results  Component Value Date   CHOL 145 09/11/2015   CHOL 142 09/12/2014   CHOL 136 08/21/2013   Lab Results  Component Value Date   HDL 30.30 (L) 09/11/2015   HDL 31.20 (L) 09/12/2014   HDL 31.00 (L) 08/21/2013   Lab Results  Component Value Date   LDLCALC 97 09/11/2015   LDLCALC 94 09/12/2014   LDLCALC 88 08/21/2013   Lab Results  Component Value Date   TRIG 90.0 09/11/2015   TRIG 84.0 09/12/2014   TRIG 84.0 08/21/2013   Lab Results  Component Value Date   CHOLHDL 5 09/11/2015   CHOLHDL 5 09/12/2014   CHOLHDL 4 08/21/2013   No results found for: LDLDIRECT  Will continue to take fish oil and exercise   Results for orders placed or performed in visit on 09/11/15  CBC with Differential/Platelet  Result Value Ref Range   WBC 5.3 4.0 - 10.5 K/uL   RBC 4.83 4.22 - 5.81 Mil/uL   Hemoglobin 14.4 13.0 - 17.0 g/dL   HCT 40.9 81.1 - 91.4 %   MCV 88.3 78.0 - 100.0 fl   MCHC 33.9 30.0 - 36.0 g/dL   RDW 78.2 95.6 - 21.3 %   Platelets 337.0 150.0 - 400.0 K/uL   Neutrophils Relative % 55.1 43.0 - 77.0 %   Lymphocytes Relative 35.0  12.0 - 46.0 %   Monocytes Relative 6.6 3.0 - 12.0 %   Eosinophils Relative 2.6 0.0 - 5.0 %   Basophils Relative 0.7 0.0 - 3.0 %   Neutro Abs 2.9 1.4 - 7.7 K/uL   Lymphs Abs 1.8 0.7 - 4.0 K/uL   Monocytes Absolute 0.3 0.1 - 1.0 K/uL   Eosinophils Absolute 0.1 0.0 - 0.7 K/uL   Basophils Absolute 0.0 0.0 - 0.1 K/uL  Comprehensive metabolic panel  Result Value Ref Range   Sodium 139 135 - 145 mEq/L   Potassium 4.4 3.5 - 5.1 mEq/L   Chloride 104 96 - 112 mEq/L   CO2 26 19 - 32 mEq/L   Glucose, Bld 106 (H) 70 - 99 mg/dL   BUN 12 6 - 23 mg/dL   Creatinine, Ser 0.86 0.40 - 1.50 mg/dL   Total Bilirubin 0.7 0.2 - 1.2 mg/dL   Alkaline Phosphatase 34 (L) 39 - 117 U/L   AST 20 0 - 37 U/L   ALT 27 0 - 53 U/L   Total Protein 7.2 6.0 - 8.3 g/dL  Albumin 4.5 3.5 - 5.2 g/dL   Calcium 9.6 8.4 - 40.9 mg/dL   GFR 811.91 >47.82 mL/min  Lipid panel  Result Value Ref Range   Cholesterol 145 0 - 200 mg/dL   Triglycerides 95.6 0.0 - 149.0 mg/dL   HDL 21.30 (L) >86.57 mg/dL   VLDL 84.6 0.0 - 96.2 mg/dL   LDL Cholesterol 97 0 - 99 mg/dL   Total CHOL/HDL Ratio 5    NonHDL 114.56   TSH  Result Value Ref Range   TSH 1.61 0.35 - 4.50 uIU/mL    Glucose high normal - eats sweets   No prostate problems  No family hx of  Prostate cancer Nocturia 0-1 (usually none)  Came off of omeprazole in the spring  Took tums Got better  Now occ heartburn- he watches his diet  Thinks his back pain got better off of it also   Has hx of bulging disc and did PT   Patient Active Problem List   Diagnosis Date Noted  . Hyperglycemia 09/16/2015  . Low HDL (under 40) 07/14/2010  . Routine general medical examination at a health care facility 07/10/2010  . GERD (gastroesophageal reflux disease) 03/27/2009   Past Medical History:  Diagnosis Date  . Chest pain, unspecified   . Family history of ischemic heart disease   . Hemorrhoids   . Other malaise and fatigue   . Pneumonia    No past surgical history on  file. Social History  Substance Use Topics  . Smoking status: Never Smoker  . Smokeless tobacco: Never Used  . Alcohol use 0.0 oz/week     Comment: rare   Family History  Problem Relation Age of Onset  . Hypertension Brother   . Hypertension Brother   . Breast cancer Mother    Allergies  Allergen Reactions  . Iohexol      Code: RASH, Desc: pt developed rash post 100 ml omni 350 for cta coronary exam. steroid taper prescribed after exam was complete. bsw 03/21/2009., Onset Date: [id number]    Current Outpatient Prescriptions on File Prior to Visit  Medication Sig Dispense Refill  . Multiple Vitamin (MULTIVITAMIN) capsule Take 1 capsule by mouth 2 (two) times daily.     . Omega-3 Fatty Acids (FISH OIL) 1000 MG CAPS Take 1 capsule by mouth 2 (two) times daily.     . vitamin C (ASCORBIC ACID) 500 MG tablet Take 500 mg by mouth daily.      . [DISCONTINUED] omeprazole (PRILOSEC OTC) 20 MG tablet Take 1 tablet (20 mg total) by mouth every morning. 30 tablet 11   No current facility-administered medications on file prior to visit.     Review of Systems Review of Systems  Constitutional: Negative for fever, appetite change, fatigue and unexpected weight change.  Eyes: Negative for pain and visual disturbance.  Respiratory: Negative for cough and shortness of breath.   Cardiovascular: Negative for cp or palpitations    Gastrointestinal: Negative for nausea, diarrhea and constipation.  Genitourinary: Negative for urgency and frequency.  Skin: Negative for pallor or rash   Neurological: Negative for weakness, light-headedness, numbness and headaches.  Hematological: Negative for adenopathy. Does not bruise/bleed easily.  Psychiatric/Behavioral: Negative for dysphoric mood. The patient is not nervous/anxious.         Objective:   Physical Exam  Constitutional: He appears well-developed and well-nourished. No distress.  overwt and well app  HENT:  Head: Normocephalic and atraumatic.    Right Ear: External ear normal.  Left Ear: External ear normal.  Nose: Nose normal.  Mouth/Throat: Oropharynx is clear and moist.  Eyes: Conjunctivae and EOM are normal. Pupils are equal, round, and reactive to light. Right eye exhibits no discharge. Left eye exhibits no discharge. No scleral icterus.  Neck: Normal range of motion. Neck supple. No JVD present. Carotid bruit is not present. No thyromegaly present.  Cardiovascular: Normal rate, regular rhythm, normal heart sounds and intact distal pulses.  Exam reveals no gallop.   Pulmonary/Chest: Effort normal and breath sounds normal. No respiratory distress. He has no wheezes. He exhibits no tenderness.  Abdominal: Soft. Bowel sounds are normal. He exhibits no distension, no abdominal bruit and no mass. There is no tenderness.  Musculoskeletal: He exhibits no edema or tenderness.  Lymphadenopathy:    He has no cervical adenopathy.  Neurological: He is alert. He has normal reflexes. No cranial nerve deficit. He exhibits normal muscle tone. Coordination normal.  Skin: Skin is warm and dry. No rash noted. No erythema. No pallor.  Tanned with some solar lentigines  Psychiatric: He has a normal mood and affect.          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

## 2015-09-16 NOTE — Assessment & Plan Note (Signed)
Reviewed health habits including diet and exercise and skin cancer prevention Reviewed appropriate screening tests for age  Also reviewed health mt list, fam hx and immunization status , as well as social and family history    Lab reviewed See HPI Will work on low glycemic diet for blood sugar

## 2015-09-16 NOTE — Progress Notes (Signed)
Pre visit review using our clinic review tool, if applicable. No additional management support is needed unless otherwise documented below in the visit note. 

## 2015-11-18 ENCOUNTER — Encounter: Payer: Self-pay | Admitting: Family Medicine

## 2015-11-18 ENCOUNTER — Ambulatory Visit (INDEPENDENT_AMBULATORY_CARE_PROVIDER_SITE_OTHER): Payer: BC Managed Care – PPO | Admitting: Family Medicine

## 2015-11-18 VITALS — BP 134/78 | HR 74 | Temp 98.1°F | Ht 68.5 in | Wt 194.5 lb

## 2015-11-18 DIAGNOSIS — R1013 Epigastric pain: Secondary | ICD-10-CM | POA: Diagnosis not present

## 2015-11-18 DIAGNOSIS — R197 Diarrhea, unspecified: Secondary | ICD-10-CM | POA: Diagnosis not present

## 2015-11-18 LAB — COMPREHENSIVE METABOLIC PANEL
ALT: 15 U/L (ref 0–53)
AST: 15 U/L (ref 0–37)
Albumin: 4 g/dL (ref 3.5–5.2)
Alkaline Phosphatase: 35 U/L — ABNORMAL LOW (ref 39–117)
BILIRUBIN TOTAL: 0.8 mg/dL (ref 0.2–1.2)
BUN: 9 mg/dL (ref 6–23)
CHLORIDE: 102 meq/L (ref 96–112)
CO2: 28 meq/L (ref 19–32)
CREATININE: 0.83 mg/dL (ref 0.40–1.50)
Calcium: 9.1 mg/dL (ref 8.4–10.5)
GFR: 108.34 mL/min (ref >60.00)
GLUCOSE: 99 mg/dL (ref 70–99)
Potassium: 3.7 mEq/L (ref 3.5–5.1)
Sodium: 137 mEq/L (ref 135–145)
Total Protein: 6.8 g/dL (ref 6.0–8.3)

## 2015-11-18 LAB — CBC WITH DIFFERENTIAL/PLATELET
BASOS ABS: 0.1 10*3/uL (ref 0.0–0.1)
Basophils Relative: 0.7 % (ref 0.0–3.0)
EOS ABS: 0.2 10*3/uL (ref 0.0–0.7)
Eosinophils Relative: 1.6 % (ref 0.0–5.0)
HCT: 43.5 % (ref 39.0–52.0)
Hemoglobin: 14.7 g/dL (ref 13.0–17.0)
LYMPHS ABS: 1.2 10*3/uL (ref 0.7–4.0)
Lymphocytes Relative: 13.3 % (ref 12.0–46.0)
MCHC: 33.9 g/dL (ref 30.0–36.0)
MCV: 88.7 fl (ref 78.0–100.0)
MONO ABS: 0.9 10*3/uL (ref 0.1–1.0)
Monocytes Relative: 9.6 % (ref 3.0–12.0)
NEUTROS ABS: 6.9 10*3/uL (ref 1.4–7.7)
NEUTROS PCT: 74.8 % (ref 43.0–77.0)
PLATELETS: 278 10*3/uL (ref 150.0–400.0)
RBC: 4.9 Mil/uL (ref 4.22–5.81)
RDW: 12.6 % (ref 11.5–15.5)
WBC: 9.2 10*3/uL (ref 4.0–10.5)

## 2015-11-18 LAB — LIPASE: Lipase: 12 U/L (ref 11.0–59.0)

## 2015-11-18 LAB — AMYLASE: Amylase: 28 U/L (ref 27–131)

## 2015-11-18 MED ORDER — RANITIDINE HCL 150 MG PO TABS: 150.0000 mg | ORAL_TABLET | Freq: Two times a day (BID) | ORAL | 1 refills | Status: DC

## 2015-11-18 NOTE — Assessment & Plan Note (Signed)
For 3 days with some epigastric pain  Reassuring exam  cmet and cbc today  Suspect viral  Enc fluids and then slow adv of diet with BRAT  Not seemingly dehydrated  Update if not starting to improve in a week or if worsening   If no imp consider stool studies

## 2015-11-18 NOTE — Progress Notes (Signed)
Pre visit review using our clinic review tool, if applicable. No additional management support is needed unless otherwise documented below in the visit note. 

## 2015-11-18 NOTE — Progress Notes (Signed)
Subjective:    Patient ID: Darryl DavidsonMichael R Jenkins, male    DOB: 04/17/1975, 40 y.o.   MRN: [id number]  HPI Here with pain in epigastric area -for 3 days  it comes and goes in a crampy fashion  Sharp pain  No burning  Not like a hunger pain  Started middle of the night - fri/sat  Before that -he ate  [employer] and fries at [employer] out - late lunch, did not eat supper  No one in house ate what he did   No n/v  No pain on the R side  No radiation to back or shoulder blade  No dizziness or blacking out    Diarrhea 3 d  Loose and watery  BM more than 10 times per day -- few times in middle of the night sat  No lower abd cramping - just urgency No blood in stool  No black stool   No fever (has felt chills)  No body aches  ? If cramps in back of legs   No one else is sick   Still off omeprazole - was fine with that /no heartburn 5-7 months   otc-took some immodium and gas relief medicine  Took tums one night  advil for pain   Patient Active Problem List   Diagnosis Date Noted  . Abdominal pain, epigastric 11/18/2015  . Diarrhea 11/18/2015  . Hyperglycemia 09/16/2015  . Low HDL (under 40) 07/14/2010  . Routine general medical examination at a health care facility 07/10/2010  . GERD (gastroesophageal reflux disease) 03/27/2009   Past Medical History:  Diagnosis Date  . Chest pain, unspecified   . Family history of ischemic heart disease   . Hemorrhoids   . Other malaise and fatigue   . Pneumonia    No past surgical history on file. Social History  Substance Use Topics  . Smoking status: Never Smoker  . Smokeless tobacco: Never Used  . Alcohol use 0.0 oz/week     Comment: rare   Family History  Problem Relation Age of Onset  . Hypertension Brother   . Hypertension Brother   . Breast cancer Mother    Allergies  Allergen Reactions  . Iohexol      Code: RASH, Desc: pt developed rash post 100 ml omni 350 for cta coronary exam. steroid taper prescribed after exam was  complete. bsw 03/21/2009., Onset Date: [id number]    Current Outpatient Prescriptions on File Prior to Visit  Medication Sig Dispense Refill  . Multiple Vitamin (MULTIVITAMIN) capsule Take 1 capsule by mouth 2 (two) times daily.     . Omega-3 Fatty Acids (FISH OIL) 1000 MG CAPS Take 1 capsule by mouth 2 (two) times daily.     . vitamin C (ASCORBIC ACID) 500 MG tablet Take 500 mg by mouth daily.      . [DISCONTINUED] omeprazole (PRILOSEC OTC) 20 MG tablet Take 1 tablet (20 mg total) by mouth every morning. 30 tablet 11   No current facility-administered medications on file prior to visit.     Review of Systems    Review of Systems  Constitutional: Negative for fever, appetite change, fatigue and unexpected weight change.  Eyes: Negative for pain and visual disturbance.  Respiratory: Negative for cough and shortness of breath.   Cardiovascular: Negative for cp or palpitations    Gastrointestinal: Negative for nausea, and constipation. neg for blood in stool or dark stool, neg for vomiting  Genitourinary: Negative for urgency and frequency.  Skin: Negative for  pallor or rash   Neurological: Negative for weakness, light-headedness, numbness and headaches.  Hematological: Negative for adenopathy. Does not bruise/bleed easily.  Psychiatric/Behavioral: Negative for dysphoric mood. The patient is not nervous/anxious.      Objective:   Physical Exam  Constitutional: He appears well-developed and well-nourished. No distress.  Well appearing   HENT:  Head: Normocephalic and atraumatic.  Mouth/Throat: Oropharynx is clear and moist.  Eyes: Conjunctivae and EOM are normal. Pupils are equal, round, and reactive to light. No scleral icterus.  Neck: Normal range of motion. Neck supple.  Cardiovascular: Normal rate, regular rhythm and normal heart sounds.   Pulmonary/Chest: Effort normal and breath sounds normal. No respiratory distress. He has no wheezes. He has no rales.  Abdominal: Soft. Bowel  sounds are normal. He exhibits no distension, no pulsatile liver, no abdominal bruit, no pulsatile midline mass and no mass. There is no hepatosplenomegaly. There is tenderness in the epigastric area. There is no rigidity, no rebound, no guarding, no CVA tenderness, no tenderness at McBurney's point and negative Murphy's sign.  Lymphadenopathy:    He has no cervical adenopathy.  Neurological: He is alert. Coordination normal.  Skin: Skin is warm and dry. No erythema. No pallor.  No jaundice or icterus  Psychiatric: He has a normal mood and affect.          Assessment & Plan:   Problem List Items Addressed This Visit      Other   Abdominal pain, epigastric    Dyspepsia sort or symptoms that started with diarrhea  Suspect viral most likely  Px ranitidine to calm acid  BRAT diet and fluids  Lab for abd pain incl lft and pancreas enzymes  Pending result for further plan  Update if not starting to improve in a week or if worsening         Relevant Orders   CBC with Differential/Platelet   Comprehensive metabolic panel   Amylase   Lipase   Diarrhea    For 3 days with some epigastric pain  Reassuring exam  cmet and cbc today  Suspect viral  Enc fluids and then slow adv of diet with BRAT  Not seemingly dehydrated  Update if not starting to improve in a week or if worsening   If no imp consider stool studies       Relevant Orders   CBC with Differential/Platelet   Comprehensive metabolic panel    Other Visit Diagnoses   None.

## 2015-11-18 NOTE — Patient Instructions (Signed)
No advil or anti inflammatories  Take immodium as needed unless it makes you more crampy  Labs today  Keep drinking lots of fluids  Bland diet (BRAT)- bananas/rice/apple sauce/toast Take the zantac (ranitidine) as needed for upper abdominal pain  Further plan with results

## 2015-11-18 NOTE — Assessment & Plan Note (Signed)
Dyspepsia sort or symptoms that started with diarrhea  Suspect viral most likely  Px ranitidine to calm acid  BRAT diet and fluids  Lab for abd pain incl lft and pancreas enzymes  Pending result for further plan  Update if not starting to improve in a week or if worsening

## 2016-06-10 ENCOUNTER — Ambulatory Visit (INDEPENDENT_AMBULATORY_CARE_PROVIDER_SITE_OTHER): Payer: BC Managed Care – PPO | Admitting: Family Medicine

## 2016-06-10 ENCOUNTER — Encounter: Payer: Self-pay | Admitting: Family Medicine

## 2016-06-10 DIAGNOSIS — W57XXXA Bitten or stung by nonvenomous insect and other nonvenomous arthropods, initial encounter: Secondary | ICD-10-CM

## 2016-06-10 DIAGNOSIS — S40261A Insect bite (nonvenomous) of right shoulder, initial encounter: Secondary | ICD-10-CM | POA: Diagnosis not present

## 2016-06-10 NOTE — Progress Notes (Signed)
Subjective:    Patient ID: Darryl Jenkins, male    DOB: Nov 06, 1975, 41 y.o.   MRN: [id number]  HPI Here for a tick bite   Woke up with a tick anterior R shoulder  Removed with tweezers - thinks he got it out (not engorged)  Not tiny (? Wood or dog tick)  Found another tick crawling on him -not attached   Neither were super small like a deer tick   He was standing under a tree on Thursday night   Still has a red spot on his shoulder Not itchy  More red but not getting bigger  No bullseye shape   No rash that he has noticed at all   No fever  No malaise  No headaches   Had a tight neck on the L side /no problems moving it  No other muscle pain   Patient Active Problem List   Diagnosis Date Noted  . Tick bite 06/10/2016  . Abdominal pain, epigastric 11/18/2015  . Diarrhea 11/18/2015  . Hyperglycemia 09/16/2015  . Low HDL (under 40) 07/14/2010  . Routine general medical examination at a health care facility 07/10/2010  . GERD (gastroesophageal reflux disease) 03/27/2009   Past Medical History:  Diagnosis Date  . Chest pain, unspecified   . Family history of ischemic heart disease   . Hemorrhoids   . Other malaise and fatigue   . Pneumonia    No past surgical history on file. Social History  Substance Use Topics  . Smoking status: Never Smoker  . Smokeless tobacco: Never Used  . Alcohol use 0.0 oz/week     Comment: rare   Family History  Problem Relation Age of Onset  . Hypertension Brother   . Hypertension Brother   . Breast cancer Mother    Allergies  Allergen Reactions  . Iohexol      Code: RASH, Desc: pt developed rash post 100 ml omni 350 for cta coronary exam. steroid taper prescribed after exam was complete. bsw 03/21/2009., Onset Date: [id number]    Current Outpatient Prescriptions on File Prior to Visit  Medication Sig Dispense Refill  . Multiple Vitamin (MULTIVITAMIN) capsule Take 1 capsule by mouth 2 (two) times daily.     . Omega-3 Fatty  Acids (FISH OIL) 1000 MG CAPS Take 1 capsule by mouth 2 (two) times daily.     . vitamin C (ASCORBIC ACID) 500 MG tablet Take 500 mg by mouth daily.      . [DISCONTINUED] omeprazole (PRILOSEC OTC) 20 MG tablet Take 1 tablet (20 mg total) by mouth every morning. 30 tablet 11   No current facility-administered medications on file prior to visit.     Review of Systems    Review of Systems  Constitutional: Negative for fever, appetite change, fatigue and unexpected weight change.  Eyes: Negative for pain and visual disturbance.  Respiratory: Negative for cough and shortness of breath.   Cardiovascular: Negative for cp or palpitations    Gastrointestinal: Negative for nausea, diarrhea and constipation.  Genitourinary: Negative for urgency and frequency.  Skin: Negative for pallor or rash  pos for red spot on ant R shoulder from tick bite  Neurological: Negative for weakness, light-headedness, numbness and headaches.  MSK neg for joint swelling or pain  Hematological: Negative for adenopathy. Does not bruise/bleed easily.  Psychiatric/Behavioral: Negative for dysphoric mood. The patient is not nervous/anxious.      Objective:   Physical Exam  Constitutional: He appears well-developed and well-nourished. No  distress.  Well appearing   HENT:  Head: Normocephalic and atraumatic.  Nose: Nose normal.  Mouth/Throat: Oropharynx is clear and moist.  Eyes: Conjunctivae and EOM are normal. Pupils are equal, round, and reactive to light. Right eye exhibits no discharge. Left eye exhibits no discharge. No scleral icterus.  Neck: Normal range of motion. Neck supple.  Cardiovascular: Normal rate and regular rhythm.   Pulmonary/Chest: Effort normal and breath sounds normal.  Musculoskeletal: He exhibits no edema or tenderness.  No joint changes Nl rom of neck w/o tenderness  Lymphadenopathy:    He has no cervical adenopathy.  Neurological: He is alert. He has normal reflexes. No cranial nerve  deficit. He exhibits normal muscle tone. Coordination normal.  Skin: Skin is warm and dry. No rash noted. There is erythema. No pallor.  1-1.5 cm area of erythema and induration over R ant shoulder  Small scab in middle No vesicle or drainage  No bullseye pattern or central clearing  No rash whatsoever anywhere   Psychiatric: He has a normal mood and affect.          Assessment & Plan:   Problem List Items Addressed This Visit      Musculoskeletal and Integument   Tick bite    R ant shoulder-from desc was a wood tick and not engorged/ likely attached less than a day  No rash  No bullseye No fever or constitutional symptoms  inst to keep clean with soap and water/ cortisone cream prn  Update if not starting to improve in a week or if worsening  - bigger/ bullseye/rash/fever or any other symptoms  Counseled on tick bite prevention

## 2016-06-10 NOTE — Progress Notes (Signed)
Pre visit review using our clinic review tool, if applicable. No additional management support is needed unless otherwise documented below in the visit note. 

## 2016-06-10 NOTE — Patient Instructions (Signed)
I think you have a local reaction to a tick bite - It looks ok  Watch for a rash / fever/ muscle pain/ headache or general illness If you notice that the tick bite gets bigger or starts to look like a bullseye please let us know   You can put some over the counter cortisone cream on it  Cold compress helps also    Keep [place] posted

## 2016-06-11 NOTE — Assessment & Plan Note (Signed)
R ant shoulder-from desc was a wood tick and not engorged/ likely attached less than a day  No rash  No bullseye No fever or constitutional symptoms  inst to keep clean with soap and water/ cortisone cream prn  Update if not starting to improve in a week or if worsening  - bigger/ bullseye/rash/fever or any other symptoms  Counseled on tick bite prevention

## 2016-09-13 ENCOUNTER — Telehealth: Payer: Self-pay | Admitting: Family Medicine

## 2016-09-13 ENCOUNTER — Encounter: Payer: Self-pay | Admitting: Family Medicine

## 2016-09-13 DIAGNOSIS — Z0000 Encounter for general adult medical examination without abnormal findings: Secondary | ICD-10-CM

## 2016-09-13 NOTE — Telephone Encounter (Signed)
-----   Message from Alvina Chouerri J Walsh sent at 09/07/2016  4:08 PM EDT ----- Regarding: Lab orders for Wednesday, 8.8.18 Patient is scheduled for CPX labs, please order future labs, Thanks , Camelia Engerri

## 2016-09-16 ENCOUNTER — Other Ambulatory Visit (INDEPENDENT_AMBULATORY_CARE_PROVIDER_SITE_OTHER): Payer: BC Managed Care – PPO

## 2016-09-16 DIAGNOSIS — Z0000 Encounter for general adult medical examination without abnormal findings: Secondary | ICD-10-CM | POA: Diagnosis not present

## 2016-09-16 LAB — CBC WITH DIFFERENTIAL/PLATELET
BASOS PCT: 1 % (ref 0.0–3.0)
Basophils Absolute: 0 10*3/uL (ref 0.0–0.1)
Eosinophils Absolute: 0.1 10*3/uL (ref 0.0–0.7)
Eosinophils Relative: 2.2 % (ref 0.0–5.0)
HEMATOCRIT: 44.1 % (ref 39.0–52.0)
Hemoglobin: 15.1 g/dL (ref 13.0–17.0)
LYMPHS PCT: 36 % (ref 12.0–46.0)
Lymphs Abs: 1.6 10*3/uL (ref 0.7–4.0)
MCHC: 34.3 g/dL (ref 30.0–36.0)
MCV: 90 fl (ref 78.0–100.0)
MONOS PCT: 8.7 % (ref 3.0–12.0)
Monocytes Absolute: 0.4 10*3/uL (ref 0.1–1.0)
NEUTROS ABS: 2.4 10*3/uL (ref 1.4–7.7)
Neutrophils Relative %: 52.1 % (ref 43.0–77.0)
Platelets: 310 10*3/uL (ref 150.0–400.0)
RBC: 4.9 Mil/uL (ref 4.22–5.81)
RDW: 12.5 % (ref 11.5–15.5)
WBC: 4.5 10*3/uL (ref 4.0–10.5)

## 2016-09-16 LAB — LIPID PANEL
CHOL/HDL RATIO: 4
CHOLESTEROL: 148 mg/dL (ref 0–200)
HDL: 35.7 mg/dL — AB (ref >39.00)
LDL Cholesterol: 96 mg/dL (ref 0–99)
NonHDL: 112.28
TRIGLYCERIDES: 82 mg/dL (ref 0.0–149.0)
VLDL: 16.4 mg/dL (ref 0.0–40.0)

## 2016-09-16 LAB — COMPREHENSIVE METABOLIC PANEL
ALBUMIN: 4.6 g/dL (ref 3.5–5.2)
ALT: 25 U/L (ref 0–53)
AST: 20 U/L (ref 0–37)
Alkaline Phosphatase: 30 U/L — ABNORMAL LOW (ref 39–117)
BILIRUBIN TOTAL: 1.5 mg/dL — AB (ref 0.2–1.2)
BUN: 12 mg/dL (ref 6–23)
CALCIUM: 9.5 mg/dL (ref 8.4–10.5)
CO2: 30 meq/L (ref 19–32)
Chloride: 103 mEq/L (ref 96–112)
Creatinine, Ser: 0.92 mg/dL (ref 0.40–1.50)
GFR: 95.82 mL/min (ref >60.00)
Glucose, Bld: 99 mg/dL (ref 70–99)
Potassium: 4.5 mEq/L (ref 3.5–5.1)
Sodium: 137 mEq/L (ref 135–145)
Total Protein: 7.1 g/dL (ref 6.0–8.3)

## 2016-09-16 LAB — TSH: TSH: 3.48 u[IU]/mL (ref 0.35–4.50)

## 2016-09-21 ENCOUNTER — Encounter: Payer: BC Managed Care – PPO | Admitting: Family Medicine

## 2016-09-28 ENCOUNTER — Ambulatory Visit (INDEPENDENT_AMBULATORY_CARE_PROVIDER_SITE_OTHER): Payer: BC Managed Care – PPO | Admitting: Family Medicine

## 2016-09-28 ENCOUNTER — Encounter: Payer: Self-pay | Admitting: Family Medicine

## 2016-09-28 VITALS — BP 126/74 | HR 62 | Temp 97.8°F | Ht 68.5 in | Wt 199.5 lb

## 2016-09-28 DIAGNOSIS — E786 Lipoprotein deficiency: Secondary | ICD-10-CM

## 2016-09-28 DIAGNOSIS — R0683 Snoring: Secondary | ICD-10-CM | POA: Diagnosis not present

## 2016-09-28 DIAGNOSIS — R739 Hyperglycemia, unspecified: Secondary | ICD-10-CM

## 2016-09-28 DIAGNOSIS — Z0000 Encounter for general adult medical examination without abnormal findings: Secondary | ICD-10-CM | POA: Diagnosis not present

## 2016-09-28 DIAGNOSIS — R400 Somnolence: Secondary | ICD-10-CM

## 2016-09-28 NOTE — Assessment & Plan Note (Signed)
With unrestful sleep and day time somnolence susp sleep apnea  Ref to sleep clinic in pulmonary to discuss

## 2016-09-28 NOTE — Progress Notes (Signed)
Subjective:    Patient ID: Darryl Jenkins, male    DOB: Jul 17, 1975, 41 y.o.   MRN: [id number]  HPI Here for health maintenance exam and to review chronic medical problems    Doing pretty well overall  Went to the beach this weekend  Developed congestion- runny also / allergy related?  Ragweed/hayfever  Taking some claritin Cannot r/o a cold also  All d/c is clear  No st     Wt Readings from Last 3 Encounters:  09/28/16 199 lb 8 oz (90.5 kg)  06/10/16 204 lb 12.8 oz (92.9 kg)  11/18/15 194 lb 8 oz (88.2 kg)  down 5 lb - working on it! Walking more /every day  Will get back to the gym  Eating better also - more balanced meals with fruit and veg   29.89 kg/m   Sport season - eating will be more on the run   Will get flu shot in the fall   Colonoscopy 3/07-- Dr Christella Hartigan  Recall is 3/19 (in epic)  tetannus shot 3/11  Hyperglycemia in the paste is resolved  Fastin glucose 99  Has worries about sleep apnea  Wakes up un refreshed  Snores and wakes himself up (if on his back)  Falls asleep easily   BP Readings from Last 3 Encounters:  09/28/16 126/74  06/10/16 134/80  11/18/15 134/78   Pulse Readings from Last 3 Encounters:  09/28/16 62  06/10/16 74  11/18/15 74      Hx of low HDL Lab Results  Component Value Date   CHOL 148 09/16/2016   CHOL 145 09/11/2015   CHOL 142 09/12/2014   Lab Results  Component Value Date   HDL 35.70 (L) 09/16/2016   HDL 30.30 (L) 09/11/2015   HDL 31.20 (L) 09/12/2014   Lab Results  Component Value Date   LDLCALC 96 09/16/2016   LDLCALC 97 09/11/2015   LDLCALC 94 09/12/2014   Lab Results  Component Value Date   TRIG 82.0 09/16/2016   TRIG 90.0 09/11/2015   TRIG 84.0 09/12/2014   Lab Results  Component Value Date   CHOLHDL 4 09/16/2016   CHOLHDL 5 09/11/2015   CHOLHDL 5 09/12/2014   No results found for: LDLDIRECT  HDL is up a bit  Walking  Fish oil   Lab Results  Component Value Date   CREATININE 0.92  09/16/2016   BUN 12 09/16/2016   NA 137 09/16/2016   K 4.5 09/16/2016   CL 103 09/16/2016   CO2 30 09/16/2016    Lab Results  Component Value Date   ALT 25 09/16/2016   AST 20 09/16/2016   ALKPHOS 30 (L) 09/16/2016   BILITOT 1.5 (H) 09/16/2016    Lab Results  Component Value Date   TSH 3.48 09/16/2016    Prostate:  Nocturia 0-1 occasionally  Feels like he empties bladder No fam hx of prostate cancer   Patient Active Problem List   Diagnosis Date Noted  . Snoring 09/28/2016  . Somnolence 09/28/2016  . Hyperglycemia 09/16/2015  . Low HDL (under 40) 07/14/2010  . Routine general medical examination at a health care facility 07/10/2010  . GERD (gastroesophageal reflux disease) 03/27/2009   Past Medical History:  Diagnosis Date  . Chest pain, unspecified   . Family history of ischemic heart disease   . Hemorrhoids   . Other malaise and fatigue   . Pneumonia    No past surgical history on file. Social History  Substance Use Topics  .  Smoking status: Never Smoker  . Smokeless tobacco: Never Used  . Alcohol use 0.0 oz/week     Comment: rare   Family History  Problem Relation Age of Onset  . Hypertension Brother   . Hypertension Brother   . Breast cancer Mother    Allergies  Allergen Reactions  . Iohexol      Code: RASH, Desc: pt developed rash post 100 ml omni 350 for cta coronary exam. steroid taper prescribed after exam was complete. bsw 03/21/2009., Onset Date: [id number]    Current Outpatient Prescriptions on File Prior to Visit  Medication Sig Dispense Refill  . Multiple Vitamin (MULTIVITAMIN) capsule Take 1 capsule by mouth 2 (two) times daily.     . Omega-3 Fatty Acids (FISH OIL) 1000 MG CAPS Take 1 capsule by mouth 2 (two) times daily.     . vitamin C (ASCORBIC ACID) 500 MG tablet Take 500 mg by mouth daily.      . [DISCONTINUED] omeprazole (PRILOSEC OTC) 20 MG tablet Take 1 tablet (20 mg total) by mouth every morning. 30 tablet 11   No current  facility-administered medications on file prior to visit.      Review of Systems Review of Systems  Constitutional: Negative for fever, appetite change,  and unexpected weight change.  Eyes: Negative for pain and visual disturbance.  Respiratory: Negative for cough and shortness of breath.   Cardiovascular: Negative for cp or palpitations    Gastrointestinal: Negative for nausea, diarrhea and constipation.  Genitourinary: Negative for urgency and frequency.  Skin: Negative for pallor or rash   Neurological: Negative for weakness, light-headedness, numbness and headaches.  Hematological: Negative for adenopathy. Does not bruise/bleed easily.  Psychiatric/Behavioral: Negative for dysphoric mood. The patient is not nervous/anxious.         Objective:   Physical Exam  Constitutional: He appears well-developed and well-nourished. No distress.  Well appearing   HENT:  Head: Normocephalic and atraumatic.  Right Ear: External ear normal.  Left Ear: External ear normal.  Nose: Nose normal.  Mouth/Throat: Oropharynx is clear and moist.  Boggy nares with clear rhinorrhea   Eyes: Pupils are equal, round, and reactive to light. Conjunctivae and EOM are normal. Right eye exhibits no discharge. Left eye exhibits no discharge. No scleral icterus.  Neck: Normal range of motion. Neck supple. No JVD present. Carotid bruit is not present. No thyromegaly present.  Cardiovascular: Normal rate, regular rhythm, normal heart sounds and intact distal pulses.  Exam reveals no gallop.   Pulmonary/Chest: Effort normal and breath sounds normal. No respiratory distress. He has no wheezes. He exhibits no tenderness.  Abdominal: Soft. Bowel sounds are normal. He exhibits no distension, no abdominal bruit and no mass. There is no tenderness.  Musculoskeletal: He exhibits no edema or tenderness.  Lymphadenopathy:    He has no cervical adenopathy.  Neurological: He is alert. He has normal reflexes. No cranial nerve  deficit. He exhibits normal muscle tone. Coordination normal.  Skin: Skin is warm and dry. No rash noted. No erythema. No pallor.  Solar lentigines diffusely Mildly tanned   Psychiatric: He has a normal mood and affect.  Cheerful and talkative           Assessment & Plan:   Problem List Items Addressed This Visit      Other   Hyperglycemia    Improved with better diet  Fasting glucose is 99 today  Enc exercise as well       Low HDL (under 40)  Disc goals for lipids and reasons to control them Rev labs with pt Rev low sat fat diet in detail Continue to work on exercise  Continue fish oil      Routine general medical examination at a health care facility - Primary    Reviewed health habits including diet and exercise and skin cancer prevention Reviewed appropriate screening tests for age  Also reviewed health mt list, fam hx and immunization status , as well as social and family history   See HPI Labs reviewed  Ref to pulm for likely sleep apnea  Colonoscopy due 3/19 pt aware       Snoring    With unrestful sleep and day time somnolence susp sleep apnea  Ref to sleep clinic in pulmonary to discuss       Relevant Orders   Ambulatory referral to Pulmonology   Somnolence   Relevant Orders   Ambulatory referral to Pulmonology

## 2016-09-28 NOTE — Assessment & Plan Note (Signed)
Improved with better diet  Fasting glucose is 99 today  Enc exercise as well

## 2016-09-28 NOTE — Patient Instructions (Addendum)
In season for allergies  Use an antihistamine and get one of the steroid nasal sprays ( flonase or nasacort)   Get a flu shot in the fall   Your colonosocpy is due 3/19 - if you don't get a letter before that please let us know    We will refer you to the pulmonary sleep center for suspicion of sleep apnea

## 2016-09-28 NOTE — Assessment & Plan Note (Signed)
Reviewed health habits including diet and exercise and skin cancer prevention Reviewed appropriate screening tests for age  Also reviewed health mt list, fam hx and immunization status , as well as social and family history   See HPI Labs reviewed  Ref to pulm for likely sleep apnea  Colonoscopy due 3/19 pt aware

## 2016-09-28 NOTE — Assessment & Plan Note (Signed)
Disc goals for lipids and reasons to control them Rev labs with pt Rev low sat fat diet in detail Continue to work on exercise  Continue fish oil

## 2016-10-14 ENCOUNTER — Institutional Professional Consult (permissible substitution): Payer: BC Managed Care – PPO | Admitting: Pulmonary Disease

## 2016-10-18 ENCOUNTER — Encounter: Payer: Self-pay | Admitting: Family Medicine

## 2016-10-19 ENCOUNTER — Encounter: Payer: Self-pay | Admitting: Internal Medicine

## 2016-10-19 ENCOUNTER — Ambulatory Visit (INDEPENDENT_AMBULATORY_CARE_PROVIDER_SITE_OTHER): Payer: BC Managed Care – PPO | Admitting: Internal Medicine

## 2016-10-19 VITALS — BP 138/100 | HR 87 | Ht 68.5 in | Wt 200.0 lb

## 2016-10-19 DIAGNOSIS — G4719 Other hypersomnia: Secondary | ICD-10-CM | POA: Diagnosis not present

## 2016-10-19 NOTE — Patient Instructions (Addendum)

## 2016-10-19 NOTE — Progress Notes (Signed)
[facility]      Assessment and Plan:  The patient is a 41 year old male with symptoms and signs of obstructive sleep apnea.  Excessive daytime sleepiness, loud snoring, unrefreshing sleep. -Symptoms and signs of obstructive sleep apnea. -We'll send for sleep study, start CPAP positive. With negative advise the patient on commercial devices that can be used to help with snoring, also advised on not sleeping on his back   Date: 10/19/2016  MRN# [id number] Darryl Jenkins 05/21/1975    Darryl Jenkins is a 41 y.o. old male seen in consultation for chief complaint of:    Chief Complaint  Patient presents with  . Sleeping Problem    ref by [employer]: daytime sleepiness; restless sleep: snores:     HPI:  Patient is seen due to symptoms of excessive daytime sleepiness. His upper score is 15. He typically goes to bed between 9:30 and 10:30 PM. He falls asleep quickly, he usually gets out of bed at 5 AM.  He notes that for several years he has been a restless sleeper, he tosses and turns a lot. He falls asleep and finds himself snoring. He wakes in the am and feels like going back to sleep.  On days when he does not have to work he wakes at same time on weekends due dogs waking him.  On days off he naps, usually nodding off while watching tv for an hour. His brother has sleep apnea.    PMHX:   Past Medical History:  Diagnosis Date  . Chest pain, unspecified   . Family history of ischemic heart disease   . Hemorrhoids   . Other malaise and fatigue   . Pneumonia    Surgical Hx:  History reviewed. No pertinent surgical history. Family Hx:  Family History  Problem Relation Age of Onset  . Hypertension Brother   . Hypertension Brother   . Breast cancer Mother    Social Hx:   Social History  Substance Use Topics  . Smoking status: Never Smoker  . Smokeless tobacco: Never Used  . Alcohol use 0.0 oz/week     Comment: rare   Medication:     Current Outpatient Prescriptions:  Marland Kitchen.  Multiple Vitamin (MULTIVITAMIN) capsule, Take 1 capsule by mouth 2 (two) times daily. , Disp: , Rfl:  .  Omega-3 Fatty Acids (FISH OIL) 1000 MG CAPS, Take 1 capsule by mouth 2 (two) times daily. , Disp: , Rfl:  .  vitamin C (ASCORBIC ACID) 500 MG tablet, Take 500 mg by mouth daily.  , Disp: , Rfl:    Allergies:  Iohexol  Review of Systems: Gen:  Denies  fever, sweats, chills HEENT: Denies blurred vision, double vision. bleeds, sore throat Cvc:  No dizziness, chest pain. Resp:   Denies cough or sputum production, shortness of breath Gi: Denies swallowing difficulty, stomach pain. Gu:  Denies bladder incontinence, burning urine Ext:   No Joint pain, stiffness. Skin: No skin rash,  hives  Endoc:  No polyuria, polydipsia. Psych: No depression, insomnia. Other:  All other systems were reviewed with the patient and were negative other that what is mentioned in the HPI.   Physical Examination:   VS: BP (!) 138/100 (BP Location: Left Arm, Cuff Size: Normal)   Pulse 87   Ht 5' 8.5" (1.74 m)   Wt 200 lb (90.7 kg)   SpO2 97%   BMI 29.97 kg/m   General Appearance: No distress  Neuro:without focal findings,  speech normal,  HEENT: PERRLA, EOM intact.   Pulmonary: normal breath sounds, No wheezing.  CardiovascularNormal S1,S2.  No m/r/g.   Abdomen: Benign, Soft, non-tender. Renal:  No costovertebral tenderness  GU:  No performed at this time. Endoc: No evident thyromegaly, no signs of acromegaly. Skin:   warm, no rashes, no ecchymosis  Extremities: normal, no cyanosis, clubbing.  Other findings:    LABORATORY PANEL:   CBC No results for input(s): WBC, HGB, HCT, PLT in the last 168 hours. ------------------------------------------------------------------------------------------------------------------  Chemistries  No results for input(s): NA, K, CL, CO2, GLUCOSE, BUN, CREATININE, CALCIUM, MG, AST, ALT, ALKPHOS, BILITOT in the last 168  hours.  Invalid input(s): GFRCGP ------------------------------------------------------------------------------------------------------------------  Cardiac Enzymes No results for input(s): TROPONINI in the last 168 hours. ------------------------------------------------------------  RADIOLOGY:  No results found.     Thank  you for the consultation and for allowing [facility] to assist in the care of your patient. Our recommendations are noted above.  Please contact us if we can be of further service.   Wells Guiles, MD.  Board Certified in Internal Medicine, Pulmonary Medicine, Critical Care Medicine, and Sleep Medicine.  [facility] Office Number: [phone]  Santiago Glad, M.D.  Billy Fischer, M.D  10/19/2016

## 2016-11-05 ENCOUNTER — Telehealth: Payer: Self-pay | Admitting: Internal Medicine

## 2016-11-05 DIAGNOSIS — G4719 Other hypersomnia: Secondary | ICD-10-CM

## 2016-11-05 NOTE — Telephone Encounter (Signed)
[place] [employer] [place] HST due to patient's BMI not being greater than 35.  Pt will need to have an in lab study to diagnosis his issues.    Please cancel HST and place order for in lab study. Rhonda J Cobb

## 2016-11-05 NOTE — Telephone Encounter (Signed)
ST cancelled due to being denied by insurance and new order placed for split night. Nothing further needed.

## 2016-11-11 ENCOUNTER — Encounter: Payer: Self-pay | Admitting: Internal Medicine

## 2016-12-14 ENCOUNTER — Ambulatory Visit: Payer: BC Managed Care – PPO | Admitting: Family Medicine

## 2017-03-11 ENCOUNTER — Encounter: Payer: Self-pay | Admitting: General Surgery

## 2017-04-21 ENCOUNTER — Other Ambulatory Visit: Payer: Self-pay

## 2017-04-21 ENCOUNTER — Encounter: Payer: Self-pay | Admitting: Family Medicine

## 2017-04-21 ENCOUNTER — Ambulatory Visit: Payer: BC Managed Care – PPO | Admitting: Family Medicine

## 2017-04-21 VITALS — BP 120/82 | HR 73 | Temp 98.2°F | Ht 68.5 in | Wt 207.2 lb

## 2017-04-21 DIAGNOSIS — J069 Acute upper respiratory infection, unspecified: Secondary | ICD-10-CM

## 2017-04-21 MED ORDER — HYDROCODONE-HOMATROPINE 5-1.5 MG/5ML PO SYRP: 5.0000 mL | ORAL_SOLUTION | Freq: Three times a day (TID) | ORAL | 0 refills | Status: DC | PRN

## 2017-04-21 MED ORDER — BENZONATATE 200 MG PO CAPS: 200.0000 mg | ORAL_CAPSULE | Freq: Three times a day (TID) | ORAL | 1 refills | Status: DC | PRN

## 2017-04-21 NOTE — Progress Notes (Signed)
Dr. Karleen HampshireSpencer T. Blayde Bacigalupi, MD, CAQ Sports Medicine [facility] Sports [facility] [address] [place], [id number] Phone: [phone] Fax: [phone]  04/21/2017  Patient: Darryl Jenkins, MRN: [id number], DOB: 07/28/1975, 42 y.o.  Primary Physician:  Tower, Audrie GallusMarne A, MD   Chief Complaint  Patient presents with  . Cough  . Nasal Congestion  . Generalized Body Aches   Subjective:   This 42 y.o. male patient presents with runny nose, sneezing, cough, sore throat, malaise and minimal / low-grade fever .   Throat, nose congestion. Sat and Sunday felt really bad, downhill really since. Prod in the AM.  Non-smoker.   No fever. Some body achies and mild chills.   The patent denies sore throat as the primary complaint. Denies sthortness of breath/wheezing, high fever, chest pain, rhinits for more than 14 days, significant myalgia, otalgia, facial pain, abdominal pain, changes in bowel or bladder.  PMH, PHS, Allergies, Problem List, Medications, Family History, and Social History have all been reviewed.  Patient Active Problem List   Diagnosis Date Noted  . Snoring 09/28/2016  . Somnolence 09/28/2016  . Hyperglycemia 09/16/2015  . Low HDL (under 40) 07/14/2010  . Routine general medical examination at a health care facility 07/10/2010  . GERD (gastroesophageal reflux disease) 03/27/2009    Past Medical History:  Diagnosis Date  . Chest pain, unspecified   . Family history of ischemic heart disease   . Hemorrhoids   . Other malaise and fatigue   . Pneumonia     History reviewed. No pertinent surgical history.  Social History   Socioeconomic History  . Marital status: Married    Spouse name: Not on file  . Number of children: Not on file  . Years of education: Not on file  . Highest education level: Not on file  Social Needs  . Financial resource strain: Not on file  . Food insecurity - worry: Not on file  . Food insecurity - inability: Not on file  .  Transportation needs - medical: Not on file  . Transportation needs - non-medical: Not on file  Occupational History  . Not on file  Tobacco Use  . Smoking status: Never Smoker  . Smokeless tobacco: Never Used  Substance and Sexual Activity  . Alcohol use: Yes    Alcohol/week: 0.0 oz    Comment: rare  . Drug use: No  . Sexual activity: Not on file  Other Topics Concern  . Not on file  Social History Narrative  . Not on file    Family History  Problem Relation Age of Onset  . Hypertension Brother   . Hypertension Brother   . Breast cancer Mother     Allergies  Allergen Reactions  . Iohexol      Code: RASH, Desc: pt developed rash post 100 ml omni 350 for cta coronary exam. steroid taper prescribed after exam was complete. bsw 03/21/2009., Onset Date: [id number]     Medication [facility]  ROS as above, eating and drinking - tolerating PO. Urinating normally. No excessive vomitting or diarrhea. O/w as above.  Objective:   Blood pressure 120/82, pulse 73, temperature 98.2 F (36.8 C), temperature source Oral, height 5' 8.5" (1.74 m), weight 207 lb 4 oz (94 kg), SpO2 95 %.  GEN: WDWN, Non-toxic, Atraumatic, normocephalic. A and O x 3. HEENT: Oropharynx clear without exudate, MMM, no significant LAD, mild rhinnorhea Ears: TM clear, COL visualized  with good landmarks CV: RRR, no m/g/r. Pulm: CTA B, no wheezes, rhonchi, or crackles, normal respiratory effort. EXT: no c/c/e Psych: well oriented, neither depressed nor anxious in appearance  Objective Data:  Assessment and Plan:   Acute URI  Supportive care reviewed with patient. See patient instruction section.  Follow-up: No Follow-up on file.  Meds ordered this encounter  Medications  . benzonatate (TESSALON) 200 MG capsule    Sig: Take 1 capsule (200 mg total) by mouth 3 (three) times daily as needed for cough.    Dispense:  50 capsule    Refill:  1  .  HYDROcodone-homatropine (HYCODAN) 5-1.5 MG/5ML syrup    Sig: Take 5 mLs by mouth every 8 (eight) hours as needed for cough.    Dispense:  120 mL    Refill:  0     Signed,  Gerber Penza T. Monda Chastain, MD   Patient's Medications  New Prescriptions   No medications on file  Previous Medications   MULTIPLE VITAMIN (MULTIVITAMIN) CAPSULE    Take 1 capsule by mouth 2 (two) times daily.    OMEGA-3 FATTY ACIDS (FISH OIL) 1000 MG CAPS    Take 1 capsule by mouth 2 (two) times daily.    VITAMIN C (ASCORBIC ACID) 500 MG TABLET    Take 500 mg by mouth daily.    Modified Medications   Modified Medication Previous Medication   BENZONATATE (TESSALON) 200 MG CAPSULE benzonatate (TESSALON) 200 MG capsule      Take 1 capsule (200 mg total) by mouth 3 (three) times daily as needed for cough.    Take 1 capsule (200 mg total) by mouth 3 (three) times daily as needed for cough.   HYDROCODONE-HOMATROPINE (HYCODAN) 5-1.5 MG/5ML SYRUP HYDROcodone-homatropine (HYCODAN) 5-1.5 MG/5ML syrup      Take 5 mLs by mouth every 8 (eight) hours as needed for cough.    Take 5 mLs by mouth every 8 (eight) hours as needed for cough.  Discontinued Medications   No medications on file

## 2017-04-23 ENCOUNTER — Encounter: Payer: Self-pay | Admitting: Family Medicine

## 2017-04-23 ENCOUNTER — Ambulatory Visit: Payer: BC Managed Care – PPO | Admitting: Family Medicine

## 2017-04-23 VITALS — BP 128/76 | HR 81 | Temp 98.4°F | Wt 205.0 lb

## 2017-04-23 DIAGNOSIS — J209 Acute bronchitis, unspecified: Secondary | ICD-10-CM

## 2017-04-23 MED ORDER — AZITHROMYCIN 250 MG PO TABS: ORAL_TABLET | ORAL | 0 refills | Status: DC

## 2017-04-23 MED ORDER — HYDROCOD POLST-CPM POLST ER 10-8 MG/5ML PO SUER: 5.0000 mL | Freq: Two times a day (BID) | ORAL | 0 refills | Status: DC | PRN

## 2017-04-23 MED ORDER — ALBUTEROL SULFATE HFA 108 (90 BASE) MCG/ACT IN AERS: 2.0000 | INHALATION_SPRAY | Freq: Four times a day (QID) | RESPIRATORY_TRACT | 1 refills | Status: DC | PRN

## 2017-04-23 MED ORDER — PREDNISONE 20 MG PO TABS: ORAL_TABLET | ORAL | 0 refills | Status: DC

## 2017-04-23 NOTE — Progress Notes (Signed)
BP 128/76 (BP Location: Left Arm, Patient Position: Sitting, Cuff Size: Normal)   Pulse 81   Temp 98.4 F (36.9 C) (Oral)   Wt 205 lb (93 kg)   SpO2 97%   BMI 30.72 kg/m    CC: cough Subjective:    Patient ID: Darryl Jenkins, male    DOB: 05/22/1975, 42 y.o.   MRN: [id number]  HPI: Darryl Jenkins is a 42 y.o. male presenting on 04/23/2017 for Cough (Was seen 2 days ago. Was given benzonatate and hydrocodone syrup, still taking. Cough worse, now nonproductive. Worse when lying down. )   1 wk h/o malaise, cough, congestion, rhinorrhea sneezing, and sore throat. Feels congestion starting in middle of throat. Wakes up coughing, gasping for air at times. Sudden onset of symptoms with body aches. Never high fever. He's been taking mucinex. Hasn't tried ibuprofen. Coughing fits with post tussive gagging.   No ear or tooth pain. No wheezing. No headaches.   Saw Dr Patsy Lageropland earlier this week with dx acute URI treated with hycodan and tessalon perls. This hasn't really helped - perls last 1 hr, hycodan cough syrup doesn't suppress cough, just makes him sleepy.   5 days out of work now.   Non smoker. No known h/o asthma.   Relevant past medical, surgical, family and social history reviewed and updated as indicated. Interim medical history since our last visit reviewed. Allergies and medications reviewed and updated. Outpatient Medications Prior to Visit  Medication Sig Dispense Refill  . Multiple Vitamin (MULTIVITAMIN) capsule Take 1 capsule by mouth 2 (two) times daily.     . Omega-3 Fatty Acids (FISH OIL) 1000 MG CAPS Take 1 capsule by mouth 2 (two) times daily.     . vitamin C (ASCORBIC ACID) 500 MG tablet Take 500 mg by mouth daily.      . benzonatate (TESSALON) 200 MG capsule Take 1 capsule (200 mg total) by mouth 3 (three) times daily as needed for cough. 50 capsule 1  . HYDROcodone-homatropine (HYCODAN) 5-1.5 MG/5ML syrup Take 5 mLs by mouth every 8 (eight) hours as needed for  cough. 120 mL 0   No facility-administered medications prior to visit.      Per HPI unless specifically indicated in ROS section below Review of Systems     Objective:    BP 128/76 (BP Location: Left Arm, Patient Position: Sitting, Cuff Size: Normal)   Pulse 81   Temp 98.4 F (36.9 C) (Oral)   Wt 205 lb (93 kg)   SpO2 97%   BMI 30.72 kg/m   Wt Readings from Last 3 Encounters:  04/23/17 205 lb (93 kg)  04/21/17 207 lb 4 oz (94 kg)  10/19/16 200 lb (90.7 kg)    Physical Exam  Constitutional: He appears well-developed and well-nourished. No distress.  HENT:  Head: Normocephalic and atraumatic.  Right Ear: Hearing, tympanic membrane, external ear and ear canal normal.  Left Ear: Hearing, tympanic membrane, external ear and ear canal normal.  Nose: Mucosal edema present. No rhinorrhea. Right sinus exhibits no maxillary sinus tenderness and no frontal sinus tenderness. Left sinus exhibits no maxillary sinus tenderness and no frontal sinus tenderness.  Mouth/Throat: Uvula is midline and mucous membranes are normal. Posterior oropharyngeal erythema (mild) present. No oropharyngeal exudate, posterior oropharyngeal edema or tonsillar abscesses.  Eyes: Conjunctivae and EOM are normal. Pupils are equal, round, and reactive to light. No scleral icterus.  Neck: Normal range of motion. Neck supple.  Cardiovascular: Normal rate, regular rhythm,  normal heart sounds and intact distal pulses.  No murmur heard. Pulmonary/Chest: Effort normal and breath sounds normal. No respiratory distress. He has no wheezes. He has no rales.  Lungs largely clear Coughing fits present  Lymphadenopathy:    He has no cervical adenopathy.  Skin: Skin is warm and dry. No rash noted.  Nursing note and vitals reviewed.     Assessment & Plan:   Problem List Items Addressed This Visit    Bronchospasm with bronchitis, acute - Primary    Progressively worsening cough with coughing fits and post tussive gagging.  Cover for atypical bronchitis infection with zpack antibiotic, will also Rx prednisone course, tussionex, and albuterol inhaler PRN. Update if not improving with treatment.           Meds ordered this encounter  Medications  . albuterol (PROVENTIL HFA;VENTOLIN HFA) 108 (90 Base) MCG/ACT inhaler    Sig: Inhale 2 puffs into the lungs every 6 (six) hours as needed for shortness of breath.    Dispense:  1 Inhaler    Refill:  1  . predniSONE (DELTASONE) 20 MG tablet    Sig: Take two tablets daily for 4 days followed by one tablet daily for 4 days    Dispense:  12 tablet    Refill:  0  . azithromycin (ZITHROMAX) 250 MG tablet    Sig: Take two tablets on day one followed by one tablet on days 2-5    Dispense:  6 each    Refill:  0  . chlorpheniramine-HYDROcodone (TUSSIONEX PENNKINETIC ER) 10-8 MG/5ML SUER    Sig: Take 5 mLs by mouth every 12 (twelve) hours as needed for cough (sedation precautions).    Dispense:  120 mL    Refill:  0   No orders of the defined types were placed in this encounter.   Follow up plan: Return if symptoms worsen or fail to improve.  [name], MD

## 2017-04-23 NOTE — Assessment & Plan Note (Signed)
Progressively worsening cough with coughing fits and post tussive gagging. Cover for atypical bronchitis infection with zpack antibiotic, will also Rx prednisone course, tussionex, and albuterol inhaler PRN. Update if not improving with treatment.

## 2017-04-23 NOTE — Patient Instructions (Signed)
I think you have a bronchitis infection with bronchospasm leading to coughing fits and shortness of breath. Treat with prednisone steroid, azithromycin antibiotic, and strongest cough medicine tussionex pennkinetic.  If ongoing coughing fits, shortness of breath, fill albuterol inhaler to use as needed.  Stop tessalon perls and hycodan cough syrup.  Update us if not improving with this.

## 2017-04-29 ENCOUNTER — Ambulatory Visit: Payer: Self-pay | Admitting: General Surgery

## 2017-05-04 ENCOUNTER — Ambulatory Visit: Payer: Self-pay | Admitting: General Surgery

## 2017-05-21 ENCOUNTER — Telehealth: Payer: Self-pay

## 2017-05-21 NOTE — Telephone Encounter (Signed)
Patient has scheduled an appointment to see dr. Milinda Antisower on Monday, 4/15 at 8am to follow up on elevated bp readings averaging 140's/90s.    Patient called back stating his bp is now 148/100 and wants to know what to do.  I triaged him and assessed the following:  No Sob, No Cp or dizziness Denies numbness, tingling, weakness or lethargy Only symptoms: some facial flushing and a intermittent mild headache but currently feels fine.  We discussed his family hx and risk factors and recommended that he immediately go to acute care or ER if symptoms get worse and/or his BP continues to elevate through the evening and weekend.  Otherwise, patient is to continue to check BP's periodically at different times through the weekend and bring record with him to his appointment with MD on Monday am.  He is not planning to do anything strenuous this weekend and verbalizes understanding of plan.  Will route to MD in case anything further is recommended.

## 2017-05-21 NOTE — Telephone Encounter (Signed)
I will see him then  Agree with advisement

## 2017-05-24 ENCOUNTER — Ambulatory Visit: Payer: BC Managed Care – PPO | Admitting: Family Medicine

## 2017-05-24 ENCOUNTER — Encounter: Payer: Self-pay | Admitting: Family Medicine

## 2017-05-24 VITALS — BP 140/90 | HR 68 | Temp 97.9°F | Ht 68.5 in | Wt 206.2 lb

## 2017-05-24 DIAGNOSIS — Z1211 Encounter for screening for malignant neoplasm of colon: Secondary | ICD-10-CM | POA: Diagnosis not present

## 2017-05-24 DIAGNOSIS — I10 Essential (primary) hypertension: Secondary | ICD-10-CM | POA: Diagnosis not present

## 2017-05-24 MED ORDER — AMLODIPINE BESYLATE 5 MG PO TABS: 5.0000 mg | ORAL_TABLET | Freq: Every day | ORAL | 11 refills | Status: DC

## 2017-05-24 NOTE — Assessment & Plan Note (Signed)
Consistently elevated bp in setting of strong fam hx of HTN (father/brothers) and with good health habits Disc essential HTN Handouts given on this and also DASH eating  Will start on amlodipine 5 mg daily (disc poss side eff such as ankle edema)  EKG- reassuring Rev last labs -also reassuring  F/u in 3-4 weeks  Alert us if any problems in the meantime

## 2017-05-24 NOTE — Patient Instructions (Addendum)
Start amlodipine 1 pill daily at the same time  If any intolerable side effects- stop it and let us know   Continue exercise  Drink lots of water  Avoid excess sodium and fast food when able   For weight loss Try to get most of your carbohydrates from produce (with the exception of white potatoes)  Eat less bread/pasta/rice/snack foods/cereals/sweets and other items from the middle of the grocery store (processed carbs)  We will refer you for a colonoscopy

## 2017-05-24 NOTE — Assessment & Plan Note (Signed)
?   If due for 5 year recall  Will refer and see

## 2017-05-24 NOTE — Progress Notes (Signed)
Subjective:    Patient ID: Darryl Jenkins, male    DOB: 03/07/1975, 42 y.o.   MRN: [id number]  HPI Here for blood pressure concerns  Has strong family hx of HTN   No symptoms from bp at all  No HA or dizziness or other symptoms  Checked it initially when he was flushed  No palpitations or chest pain   EKG today NSR with rate of 67 and no acute changes   His father had cardiomyopathy at an early age    At home he checked it (arm cuff)- systolic 120s to 409150  (avg 130s)  Diastolic 79-100  Exercise- good  Working out in the [employer] /jogging and weight lifting   Diet has been fair / he does watch what he eats  Hard to loose weight  Trying to stick to 1800 cal on myfitnesspal  No excess sodium that he know of expect fast food with ball games etc   No otc medicines     Wt Readings from Last 3 Encounters:  05/24/17 206 lb 4 oz (93.6 kg)  04/23/17 205 lb (93 kg)  04/21/17 207 lb 4 oz (94 kg)   30.90 kg/m    BP Readings from Last 3 Encounters:  05/24/17 (!) 152/94  04/23/17 128/76  04/21/17 120/82    Re check 140/90    Lab Results  Component Value Date   CREATININE 0.92 09/16/2016   BUN 12 09/16/2016   NA 137 09/16/2016   K 4.5 09/16/2016   CL 103 09/16/2016   CO2 30 09/16/2016   Lab Results  Component Value Date   ALT 25 09/16/2016   AST 20 09/16/2016   ALKPHOS 30 (L) 09/16/2016   BILITOT 1.5 (H) 09/16/2016   Lab Results  Component Value Date   CHOL 148 09/16/2016   HDL 35.70 (L) 09/16/2016   LDLCALC 96 09/16/2016   TRIG 82.0 09/16/2016   CHOLHDL 4 09/16/2016   Also needs ref for colonoscopy- it was never scheduled for 5 y recall (unsure)  Patient Active Problem List   Diagnosis Date Noted  . Colon cancer screening 05/24/2017  . Essential hypertension 05/24/2017  . Snoring 09/28/2016  . Somnolence 09/28/2016  . Hyperglycemia 09/16/2015  . Bronchospasm with bronchitis, acute 02/01/2012  . Low HDL (under 40) 07/14/2010  . Routine general  medical examination at a health care facility 07/10/2010  . GERD (gastroesophageal reflux disease) 03/27/2009   Past Medical History:  Diagnosis Date  . Chest pain, unspecified   . Family history of ischemic heart disease   . Hemorrhoids   . Other malaise and fatigue   . Pneumonia    History reviewed. No pertinent surgical history. Social History   Tobacco Use  . Smoking status: Never Smoker  . Smokeless tobacco: Never Used  Substance Use Topics  . Alcohol use: Yes    Alcohol/week: 0.0 oz    Comment: rare  . Drug use: No   Family History  Problem Relation Age of Onset  . Hypertension Brother   . Hypertension Brother   . Breast cancer Mother    Allergies  Allergen Reactions  . Iohexol      Code: RASH, Desc: pt developed rash post 100 ml omni 350 for cta coronary exam. steroid taper prescribed after exam was complete. bsw 03/21/2009., Onset Date: [id number]    Current Outpatient Medications on File Prior to Visit  Medication Sig Dispense Refill  . albuterol (PROVENTIL HFA;VENTOLIN HFA) 108 (90 Base) MCG/ACT inhaler Inhale  2 puffs into the lungs every 6 (six) hours as needed for shortness of breath. 1 Inhaler 1  . Multiple Vitamin (MULTIVITAMIN) capsule Take 1 capsule by mouth 2 (two) times daily.     . Omega-3 Fatty Acids (FISH OIL) 1000 MG CAPS Take 1 capsule by mouth 2 (two) times daily.     . vitamin C (ASCORBIC ACID) 500 MG tablet Take 500 mg by mouth daily.      . [DISCONTINUED] omeprazole (PRILOSEC OTC) 20 MG tablet Take 1 tablet (20 mg total) by mouth every morning. 30 tablet 11   No current facility-administered medications on file prior to visit.     Review of Systems  Constitutional: Negative for activity change, appetite change, fatigue, fever and unexpected weight change.  HENT: Negative for congestion, rhinorrhea, sore throat and trouble swallowing.   Eyes: Negative for pain, redness, itching and visual disturbance.  Respiratory: Negative for cough, chest  tightness, shortness of breath and wheezing.   Cardiovascular: Negative for chest pain and palpitations.  Gastrointestinal: Negative for abdominal pain, blood in stool, constipation, diarrhea and nausea.  Endocrine: Negative for cold intolerance, heat intolerance, polydipsia and polyuria.  Genitourinary: Negative for difficulty urinating, dysuria, frequency and urgency.  Musculoskeletal: Negative for arthralgias, joint swelling and myalgias.  Skin: Negative for pallor and rash.  Neurological: Negative for dizziness, tremors, weakness, numbness and headaches.  Hematological: Negative for adenopathy. Does not bruise/bleed easily.  Psychiatric/Behavioral: Negative for decreased concentration and dysphoric mood. The patient is not nervous/anxious.        Objective:   Physical Exam  Constitutional: He appears well-developed and well-nourished. No distress.  HENT:  Head: Normocephalic and atraumatic.  Mouth/Throat: Oropharynx is clear and moist.  Eyes: Pupils are equal, round, and reactive to light. Conjunctivae and EOM are normal.  Neck: Normal range of motion. Neck supple. No JVD present. Carotid bruit is not present. No thyromegaly present.  Cardiovascular: Normal rate, regular rhythm, normal heart sounds and intact distal pulses. Exam reveals no gallop.  Pulmonary/Chest: Effort normal and breath sounds normal. No respiratory distress. He has no wheezes. He has no rales.  No crackles  Abdominal: Soft. Bowel sounds are normal. He exhibits no distension, no abdominal bruit and no mass. There is no tenderness.  Musculoskeletal: He exhibits no edema.  Lymphadenopathy:    He has no cervical adenopathy.  Neurological: He is alert. He has normal reflexes.  Skin: Skin is warm and dry. No rash noted.  Psychiatric: He has a normal mood and affect.          Assessment & Plan:   Problem List Items Addressed This Visit      Cardiovascular and Mediastinum   Essential hypertension     Consistently elevated bp in setting of strong fam hx of HTN (father/brothers) and with good health habits Disc essential HTN Handouts given on this and also DASH eating  Will start on amlodipine 5 mg daily (disc poss side eff such as ankle edema)  EKG- reassuring Rev last labs -also reassuring  F/u in 3-4 weeks  Alert [place] if any problems in the meantime         Relevant Medications   amLODipine (NORVASC) 5 MG tablet   Other Relevant Orders   EKG 12-Lead (Completed)     Other   Colon cancer screening    ? If due for 5 year recall  Will refer and see      Relevant Orders   Ambulatory referral to Gastroenterology

## 2017-06-15 ENCOUNTER — Ambulatory Visit: Payer: BC Managed Care – PPO | Admitting: Family Medicine

## 2017-06-17 ENCOUNTER — Telehealth: Payer: Self-pay | Admitting: Internal Medicine

## 2017-06-17 ENCOUNTER — Encounter: Payer: Self-pay | Admitting: Internal Medicine

## 2017-06-17 NOTE — Telephone Encounter (Signed)
3 attempts to schedule fu appt from recall list.   Deleting recall.  °Mailed Letter  °

## 2017-06-23 ENCOUNTER — Encounter: Payer: Self-pay | Admitting: Family Medicine

## 2017-06-23 ENCOUNTER — Ambulatory Visit: Payer: BC Managed Care – PPO | Admitting: Family Medicine

## 2017-06-23 DIAGNOSIS — I10 Essential (primary) hypertension: Secondary | ICD-10-CM | POA: Diagnosis not present

## 2017-06-23 NOTE — Assessment & Plan Note (Signed)
Improved/at goal with amlodipine 5 mg  bp in fair control at this time  BP Readings from Last 1 Encounters:  06/23/17 122/82   No changes needed Most recent labs reviewed  Disc lifstyle change with low sodium diet and exercise   F/u aug as planned

## 2017-06-23 NOTE — Progress Notes (Signed)
Subjective:    Patient ID: Darryl Jenkins, male    DOB: 09-04-1975, 42 y.o.   MRN: [id number]  HPI Here for f/u of HTN   Wt Readings from Last 3 Encounters:  06/23/17 202 lb 4 oz (91.7 kg)  05/24/17 206 lb 4 oz (93.6 kg)  04/23/17 205 lb (93 kg)   30.30 kg/m   Started amlodipine for new essential HTN-no side effects  BP Readings from Last 3 Encounters:  06/23/17 122/82  05/24/17 140/90  04/23/17 128/76   Improved !  Is a little higher at home - relatively new cuff- he checks it at work  Mid 130s/80s   Walking more - 3 miles every other day       Chemistry      Component Value Date/Time   NA 137 09/16/2016 0813   K 4.5 09/16/2016 0813   CL 103 09/16/2016 0813   CO2 30 09/16/2016 0813   BUN 12 09/16/2016 0813   CREATININE 0.92 09/16/2016 0813      Component Value Date/Time   CALCIUM 9.5 09/16/2016 0813   ALKPHOS 30 (L) 09/16/2016 0813   AST 20 09/16/2016 0813   ALT 25 09/16/2016 0813   BILITOT 1.5 (H) 09/16/2016 0813     Lab Results  Component Value Date   WBC 4.5 09/16/2016   HGB 15.1 09/16/2016   HCT 44.1 09/16/2016   MCV 90.0 09/16/2016   PLT 310.0 09/16/2016   Lab Results  Component Value Date   TSH 3.48 09/16/2016    Lab Results  Component Value Date   CHOL 148 09/16/2016   HDL 35.70 (L) 09/16/2016   LDLCALC 96 09/16/2016   TRIG 82.0 09/16/2016   CHOLHDL 4 09/16/2016    Review of Systems  Constitutional: Negative for activity change, appetite change, fatigue, fever and unexpected weight change.  HENT: Negative for congestion, rhinorrhea, sore throat and trouble swallowing.   Eyes: Negative for pain, redness, itching and visual disturbance.  Respiratory: Negative for cough, chest tightness, shortness of breath and wheezing.   Cardiovascular: Negative for chest pain and palpitations.  Gastrointestinal: Negative for abdominal pain, blood in stool, constipation, diarrhea and nausea.  Endocrine: Negative for cold intolerance, heat  intolerance, polydipsia and polyuria.  Genitourinary: Negative for difficulty urinating, dysuria, frequency and urgency.  Musculoskeletal: Negative for arthralgias, joint swelling and myalgias.  Skin: Negative for pallor and rash.  Neurological: Negative for dizziness, tremors, weakness, numbness and headaches.  Hematological: Negative for adenopathy. Does not bruise/bleed easily.  Psychiatric/Behavioral: Negative for decreased concentration and dysphoric mood. The patient is not nervous/anxious.        Pos for stressors  Will change job in a mo which should help       Objective:   Physical Exam  Constitutional: He appears well-developed and well-nourished. No distress.  Well appearing   HENT:  Head: Normocephalic and atraumatic.  Mouth/Throat: Oropharynx is clear and moist.  Eyes: Pupils are equal, round, and reactive to light. Conjunctivae and EOM are normal.  Neck: Normal range of motion. Neck supple. No JVD present. Carotid bruit is not present. No thyromegaly present.  Cardiovascular: Normal rate, regular rhythm, normal heart sounds and intact distal pulses. Exam reveals no gallop.  Pulmonary/Chest: Effort normal and breath sounds normal. No respiratory distress. He has no wheezes. He has no rales.  No crackles  Abdominal: He exhibits no abdominal bruit.  Musculoskeletal: He exhibits no edema.  Lymphadenopathy:    He has no cervical adenopathy.  Neurological: He  is alert. He has normal reflexes.  Skin: Skin is warm and dry. No rash noted.  Psychiatric: He has a normal mood and affect.          Assessment & Plan:   Problem List Items Addressed This Visit      Cardiovascular and Mediastinum   Essential hypertension    Improved/at goal with amlodipine 5 mg  bp in fair control at this time  BP Readings from Last 1 Encounters:  06/23/17 122/82   No changes needed Most recent labs reviewed  Disc lifstyle change with low sodium diet and exercise   F/u aug as planned

## 2017-06-23 NOTE — Patient Instructions (Addendum)
Blood pressure is improved and at goal  Continue amlodipine  If any problems let us know   See you in august  Keep working on healthy habits

## 2017-07-27 ENCOUNTER — Ambulatory Visit: Payer: Self-pay

## 2017-07-27 ENCOUNTER — Ambulatory Visit: Payer: BC Managed Care – PPO | Admitting: Internal Medicine

## 2017-07-27 ENCOUNTER — Encounter: Payer: Self-pay | Admitting: Family Medicine

## 2017-07-27 ENCOUNTER — Ambulatory Visit: Payer: BC Managed Care – PPO | Admitting: Family Medicine

## 2017-07-27 VITALS — BP 118/82 | HR 71 | Temp 98.1°F | Ht 68.5 in | Wt 201.0 lb

## 2017-07-27 DIAGNOSIS — R05 Cough: Secondary | ICD-10-CM | POA: Diagnosis not present

## 2017-07-27 DIAGNOSIS — R058 Other specified cough: Secondary | ICD-10-CM

## 2017-07-27 MED ORDER — PREDNISONE 10 MG PO TABS: ORAL_TABLET | ORAL | 0 refills | Status: DC

## 2017-07-27 NOTE — Patient Instructions (Addendum)
If you feel chest congestion- get some mucinex  Drink fluids   Take the prednisone as directed  It may make you hyper and hungry   Update if not starting to improve in a week or if worsening

## 2017-07-27 NOTE — Progress Notes (Signed)
Subjective:    Patient ID: Darryl Jenkins, male    DOB: 04/28/1975, 42 y.o.   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 Productive cough 07/27/2017  . Colon cancer screening 05/24/2017  . Essential hypertension 05/24/2017  . Snoring 09/28/2016  . Somnolence 09/28/2016  . Hyperglycemia 09/16/2015  . Bronchospasm with bronchitis, acute 02/01/2012  . Low HDL (under 40) 07/14/2010  . Routine general medical examination at a health care facility 07/10/2010  . GERD (gastroesophageal reflux disease) 03/27/2009   Past Medical History:  Diagnosis Date  . Chest pain, unspecified   . Family history of ischemic heart disease   . Hemorrhoids   . Other malaise and fatigue   . Pneumonia    History reviewed. No pertinent surgical history. Social History   Tobacco Use  . Smoking status: Never Smoker  . Smokeless tobacco: Never Used  Substance Use Topics  . Alcohol use: Yes    Alcohol/week: 0.0 oz    Comment: rare  . Drug use: No   Family History  Problem Relation Age of Onset  . Hypertension Brother   . Hypertension Brother   . Breast cancer Mother    Allergies  Allergen Reactions  . Iohexol    Code: RASH, Desc: pt developed rash post 100 ml omni 350 for cta coronary exam. steroid taper prescribed after exam was complete. bsw 03/21/2009., Onset Date: [id number]    Current Outpatient Medications on File Prior to Visit  Medication Sig Dispense Refill  . amLODipine (NORVASC) 5 MG tablet Take 1 tablet (5 mg total) by mouth daily. 30 tablet 11  . Multiple Vitamin (MULTIVITAMIN) capsule Take 1 capsule by mouth 2 (two) times daily.     . Omega-3 Fatty Acids (FISH OIL) 1000 MG CAPS Take 1 capsule by mouth 2 (two) times daily.     . vitamin C (ASCORBIC ACID) 500 MG tablet Take 500 mg by mouth daily.      . [DISCONTINUED] omeprazole (PRILOSEC OTC) 20 MG tablet Take 1 tablet (20 mg total) by mouth every morning. 30 tablet 11   No current facility-administered medications on file prior to visit.      Review of Systems  Constitutional: Negative for activity change, appetite change, fatigue, fever and unexpected weight change.  HENT: Negative for congestion, rhinorrhea, sore throat and trouble swallowing.   Eyes: Negative for pain, redness, itching and visual disturbance.  Respiratory: Positive for cough. Negative for apnea, choking, chest tightness, shortness of breath, wheezing and stridor.   Cardiovascular: Negative for chest pain and palpitations.  Gastrointestinal: Negative for abdominal pain,  blood in stool, constipation, diarrhea and nausea.  Endocrine: Negative for cold intolerance, heat intolerance, polydipsia and polyuria.  Genitourinary: Negative for difficulty urinating, dysuria, frequency and urgency.  Musculoskeletal: Negative for arthralgias, joint swelling and myalgias.  Skin: Negative for pallor and rash.  Neurological: Negative for dizziness, tremors, weakness, numbness and headaches.  Hematological: Negative for adenopathy. Does not bruise/bleed easily.  Psychiatric/Behavioral: Negative for decreased concentration and dysphoric mood. The patient is not nervous/anxious.         Objective:   Physical Exam  Constitutional: He is oriented to person, place, and time. He appears well-developed and well-nourished. No distress.  Well appearing   HENT:  Head: Normocephalic and atraumatic.  Right Ear: External ear normal.  Left Ear: External ear normal.  Nose: Nose normal.  Mouth/Throat: Oropharynx is clear and moist.  Eyes: Pupils are equal, round, and reactive to light. Conjunctivae and EOM are normal. Right eye exhibits no discharge. Left eye exhibits no discharge. No scleral icterus.  Neck: Normal range of motion. Neck supple.  Cardiovascular: Normal rate, regular rhythm and normal heart sounds.  Pulmonary/Chest: Effort normal and breath sounds normal. No stridor. No respiratory distress. He has no wheezes. He has no rales. He exhibits no tenderness.  No wheeze even on forced exp  Nl exp time No crackles   Lymphadenopathy:    He has no cervical adenopathy.  Neurological: He is alert and oriented to person, place, and time. No cranial nerve deficit. Coordination normal.  Skin: Skin is warm and dry. No rash noted. No erythema. No pallor.  Psychiatric: He has a normal mood and affect.          Assessment & Plan:   Problem List Items Addressed This Visit      Other   Productive cough - Primary    After exp to dust in a classroom (was tested-not asbestos)  Some feeling of irritation in upper airway  tx with prednisone  Nl exam -reassuring  Watch closely Update if not starting to improve in a week or if worsening

## 2017-07-27 NOTE — Telephone Encounter (Signed)
I will see him then

## 2017-07-27 NOTE — Telephone Encounter (Signed)
Pt has appt today at 10 AM with Dr Milinda Antisower.

## 2017-07-27 NOTE — Assessment & Plan Note (Addendum)
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

## 2017-07-27 NOTE — Telephone Encounter (Signed)
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 It was determined not to be asbestos. Pt. Is having throat and upper airway irritation for 4-5 days. States he does not have a cough or shortness of breath.Appointment made with his provider today.  Reason for Disposition . [1] Sore throat is the only symptom AND [2] present > 48 hours  Answer Assessment - Initial Assessment Questions 1. ONSET: "When did the throat start hurting?" (Hours or days ago)      Started last week 2. SEVERITY: "How bad is the sore throat?" (Scale 1-10; mild, moderate or severe)   - MILD (1-3):  doesn't interfere with eating or normal activities   - MODERATE (4-7): interferes with eating some solids and normal activities   - SEVERE (8-10):  excruciating pain, interferes with most normal activities   - SEVERE DYSPHAGIA: can't swallow liquids, drooling     Mild - hurts down to sternum 3. STREP EXPOSURE: "Has there been any exposure to strep within the past week?" If so, ask: "What type of contact occurred?"      No 4.  VIRAL SYMPTOMS: "Are there any symptoms of a cold, such as a runny nose, cough, hoarse voice or red eyes?"      No 5. FEVER: "Do you have a fever?" If so, ask: "What is your temperature, how was it measured, and when did it start?"     No 6. PUS ON THE TONSILS: "Is there pus on the tonsils in the back of your throat?"     No 7. OTHER SYMPTOMS: "Do you have any other symptoms?" (e.g., difficulty breathing, headache, rash)     Chest will feel a little tight 8. PREGNANCY: "Is there any chance you are pregnant?" "When was your last menstrual period?"     N/A  Protocols used: SORE THROAT-A-AH

## 2017-08-17 ENCOUNTER — Encounter: Payer: Self-pay | Admitting: Family Medicine

## 2017-08-17 NOTE — Telephone Encounter (Signed)
Dr. Milinda Antisower see form under media tab

## 2017-08-19 NOTE — Telephone Encounter (Signed)
I set pt up for TB nurse visit on 09/07/17. His last CPE and was 09/28/16- not due until 09/29/17 and is set up for cpe 10/04/17. Pt is asking if he can get the TB test done and fill out the health form based on last year's cpe since it is due 7/31. I printed health certification form and placed in Rx tower.

## 2017-09-06 ENCOUNTER — Ambulatory Visit: Payer: BC Managed Care – PPO | Admitting: Family Medicine

## 2017-09-07 ENCOUNTER — Ambulatory Visit (INDEPENDENT_AMBULATORY_CARE_PROVIDER_SITE_OTHER): Payer: BC Managed Care – PPO | Admitting: *Deleted

## 2017-09-07 DIAGNOSIS — Z111 Encounter for screening for respiratory tuberculosis: Secondary | ICD-10-CM

## 2017-09-09 LAB — TB SKIN TEST
INDURATION: 0 mm
TB SKIN TEST: NEGATIVE

## 2017-09-23 ENCOUNTER — Ambulatory Visit: Payer: BC Managed Care – PPO | Admitting: Family Medicine

## 2017-09-23 ENCOUNTER — Encounter: Payer: Self-pay | Admitting: Family Medicine

## 2017-09-23 VITALS — BP 132/82 | HR 80 | Temp 99.1°F | Ht 68.5 in | Wt 199.5 lb

## 2017-09-23 DIAGNOSIS — R509 Fever, unspecified: Secondary | ICD-10-CM

## 2017-09-23 DIAGNOSIS — W57XXXA Bitten or stung by nonvenomous insect and other nonvenomous arthropods, initial encounter: Secondary | ICD-10-CM | POA: Diagnosis not present

## 2017-09-23 LAB — CBC WITH DIFFERENTIAL/PLATELET
BASOS PCT: 0.6 % (ref 0.0–3.0)
Basophils Absolute: 0 10*3/uL (ref 0.0–0.1)
EOS PCT: 1.1 % (ref 0.0–5.0)
Eosinophils Absolute: 0.1 10*3/uL (ref 0.0–0.7)
HEMATOCRIT: 42.1 % (ref 39.0–52.0)
HEMOGLOBIN: 14.9 g/dL (ref 13.0–17.0)
LYMPHS PCT: 13.4 % (ref 12.0–46.0)
Lymphs Abs: 0.8 10*3/uL (ref 0.7–4.0)
MCHC: 35.3 g/dL (ref 30.0–36.0)
MCV: 87.5 fl (ref 78.0–100.0)
Monocytes Absolute: 0.6 10*3/uL (ref 0.1–1.0)
Monocytes Relative: 9.2 % (ref 3.0–12.0)
NEUTROS ABS: 4.6 10*3/uL (ref 1.4–7.7)
Neutrophils Relative %: 75.7 % (ref 43.0–77.0)
PLATELETS: 261 10*3/uL (ref 150.0–400.0)
RBC: 4.81 Mil/uL (ref 4.22–5.81)
RDW: 12.4 % (ref 11.5–15.5)
WBC: 6.1 10*3/uL (ref 4.0–10.5)

## 2017-09-23 MED ORDER — DOXYCYCLINE HYCLATE 100 MG PO TABS: 100.0000 mg | ORAL_TABLET | Freq: Two times a day (BID) | ORAL | 0 refills | Status: DC

## 2017-09-23 NOTE — Progress Notes (Signed)
Subjective:    Patient ID: Darryl Jenkins, male    DOB: 06/13/1975, 42 y.o.   MRN: [id number]  HPI Here for fever/aches  Also insect bite  Wt Readings from Last 3 Encounters:  09/23/17 199 lb 8 oz (90.5 kg)  07/27/17 201 lb (91.2 kg)  06/23/17 202 lb 4 oz (91.7 kg)   Temp: 99.1 F (37.3 C)   Sunday early-woke up with achy legs  Then sweats  No temp registered until tues - highest 100.5  Chills /aches   Found some little bites on foot (left)  Joints and muscles ache Yesterday pains in random places   No rash   Has not pulled ticks off  Has been out in the garden   No upper resp symptoms  No cough  No dysuria - but has some inc urination at night with new bp medicine   No sick exposures known   No n/v Stool is a little softer than usual  Taking ibuprofen 2 every 6 hours    Patient Active Problem List   Diagnosis Date Noted  . Fever and chills 09/23/2017  . Multiple insect bites 09/23/2017  . Colon cancer screening 05/24/2017  . Essential hypertension 05/24/2017  . Snoring 09/28/2016  . Somnolence 09/28/2016  . Hyperglycemia 09/16/2015  . Low HDL (under 40) 07/14/2010  . Routine general medical examination at a health care facility 07/10/2010  . GERD (gastroesophageal reflux disease) 03/27/2009   Past Medical History:  Diagnosis Date  . Chest pain, unspecified   . Family history of ischemic heart disease   . Hemorrhoids   . Other malaise and fatigue   . Pneumonia    History reviewed. No pertinent surgical history. Social History   Tobacco Use  . Smoking status: Never Smoker  . Smokeless tobacco: Never Used  Substance Use Topics  . Alcohol use: Yes    Alcohol/week: 0.0 standard drinks    Comment: rare  . Drug use: No   Family History  Problem Relation Age of Onset  . Hypertension Brother   . Hypertension Brother   . Breast cancer Mother    Allergies  Allergen Reactions  . Iohexol      Code: RASH, Desc: pt developed rash post 100 ml omni  350 for cta coronary exam. steroid taper prescribed after exam was complete. bsw 03/21/2009., Onset Date: [id number]    Current Outpatient Medications on File Prior to Visit  Medication Sig Dispense Refill  . amLODipine (NORVASC) 5 MG tablet Take 1 tablet (5 mg total) by mouth daily. 30 tablet 11  . Multiple Vitamin (MULTIVITAMIN) capsule Take 1 capsule by mouth 2 (two) times daily.     . Omega-3 Fatty Acids (FISH OIL) 1000 MG CAPS Take 1 capsule by mouth 2 (two) times daily.     . vitamin C (ASCORBIC ACID) 500 MG tablet Take 500 mg by mouth daily.      . [DISCONTINUED] omeprazole (PRILOSEC OTC) 20 MG tablet Take 1 tablet (20 mg total) by mouth every morning. 30 tablet 11   No current facility-administered medications on file prior to visit.     Review of Systems  Constitutional: Positive for appetite change, chills, fatigue and fever. Negative for activity change and unexpected weight change.  HENT: Negative for congestion, rhinorrhea, sore throat and trouble swallowing.   Eyes: Negative for pain, redness, itching and visual disturbance.  Respiratory: Negative for cough, chest tightness, shortness of breath and wheezing.   Cardiovascular: Negative for chest pain and  palpitations.  Gastrointestinal: Negative for abdominal pain, blood in stool, constipation, diarrhea and nausea.  Endocrine: Negative for cold intolerance, heat intolerance, polydipsia and polyuria.  Genitourinary: Negative for difficulty urinating, dysuria, frequency and urgency.  Musculoskeletal: Positive for arthralgias, back pain and myalgias. Negative for joint swelling.  Skin: Negative for pallor and rash.       Insect bites   Neurological: Negative for dizziness, tremors, weakness, numbness and headaches.  Hematological: Negative for adenopathy. Does not bruise/bleed easily.  Psychiatric/Behavioral: Negative for decreased concentration and dysphoric mood. The patient is not nervous/anxious.        Objective:    Physical Exam  Constitutional: He is oriented to person, place, and time. He appears well-developed and well-nourished. No distress.  Well but fatigued appearing   HENT:  Head: Normocephalic and atraumatic.  Right Ear: External ear normal.  Left Ear: External ear normal.  Nose: Nose normal.  Mouth/Throat: Oropharynx is clear and moist. No oropharyngeal exudate.  Eyes: Pupils are equal, round, and reactive to light. Conjunctivae and EOM are normal. Right eye exhibits no discharge. Left eye exhibits no discharge. No scleral icterus.  Neck: Normal range of motion. Neck supple. No JVD present. No thyromegaly present.  Cardiovascular: Normal rate, regular rhythm and normal heart sounds.  No murmur heard. Pulmonary/Chest: Effort normal and breath sounds normal. No stridor. No respiratory distress. He has no wheezes. He has no rales.  Abdominal: Soft. Bowel sounds are normal. He exhibits no distension and no mass. There is no tenderness. There is no rebound and no guarding.  No suprapubic tenderness or fullness  No cva tenderness   Musculoskeletal: Normal range of motion. He exhibits no edema or deformity.  Lymphadenopathy:    He has no cervical adenopathy.  Neurological: He is alert and oriented to person, place, and time. He displays normal reflexes. No cranial nerve deficit. Coordination normal.  Skin: Skin is warm and dry. No pallor.  Some tiny pink papules on L foot/ankle consistent with insect bites   Several erythematous lesions on R upper thigh -one is a small pustule (looks like a pimple)   3 mm macule R upper thigh (? Insect bite) No bullseye lesion  No rash   Psychiatric: He has a normal mood and affect.          Assessment & Plan:   Problem List Items Addressed This Visit      Other   Fever and chills - Primary    Mild with aches and joint pain -no uri or GI or urinary symptoms  Several insect bites/no known tick bites  Given lack of other cause will tx empirically  for tick bourne illness with doxycycline Lyme and RMSF ab today Cbc today  inst to update if worse or any new symptoms       Relevant Orders   CBC with Differential/Platelet (Completed)   B. Burgdorfi Antibodies   Rocky mtn spotted fvr abs pnl(IgG+IgM)   Multiple insect bites    Multiple tiny papules on L foot  Larger papules R upper thigh One macular lesion - pink -3 mm also upper thigh  No ticks found       Relevant Orders   CBC with Differential/Platelet (Completed)   B. Burgdorfi Antibodies   Rocky mtn spotted fvr abs pnl(IgG+IgM)

## 2017-09-23 NOTE — Patient Instructions (Addendum)
Drink fluids Get extra rest  Continue ibuprofen if it helps- with food   Doxycycline to cover for possible for tick bourne illness  Take twice daily with non dairy food   We will do labs today for cbc and tick bourne illness

## 2017-09-23 NOTE — Assessment & Plan Note (Signed)
Mild with aches and joint pain -no uri or GI or urinary symptoms  Several insect bites/no known tick bites  Given lack of other cause will tx empirically for tick bourne illness with doxycycline Lyme and RMSF ab today Cbc today  inst to update if worse or any new symptoms

## 2017-09-23 NOTE — Assessment & Plan Note (Signed)
Multiple tiny papules on L foot  Larger papules R upper thigh One macular lesion - pink -3 mm also upper thigh  No ticks found

## 2017-09-26 ENCOUNTER — Telehealth: Payer: Self-pay | Admitting: Family Medicine

## 2017-09-26 DIAGNOSIS — Z0000 Encounter for general adult medical examination without abnormal findings: Secondary | ICD-10-CM

## 2017-09-26 DIAGNOSIS — R739 Hyperglycemia, unspecified: Secondary | ICD-10-CM

## 2017-09-26 DIAGNOSIS — E786 Lipoprotein deficiency: Secondary | ICD-10-CM

## 2017-09-26 LAB — ROCKY MTN SPOTTED FVR ABS PNL(IGG+IGM)
RMSF IgG: NOT DETECTED
RMSF IgM: NOT DETECTED

## 2017-09-26 LAB — B. BURGDORFI ANTIBODIES

## 2017-09-26 NOTE — Telephone Encounter (Signed)
-----   Message from Wendi MayaLauren Greeson, RT sent at 09/21/2017  9:14 AM EDT ----- Regarding: Lab orders for Monday 09/27/17 Please enter CPE lab orders for 09/27/17. Thanks-Lauren

## 2017-09-27 ENCOUNTER — Other Ambulatory Visit (INDEPENDENT_AMBULATORY_CARE_PROVIDER_SITE_OTHER): Payer: BC Managed Care – PPO

## 2017-09-27 DIAGNOSIS — Z0000 Encounter for general adult medical examination without abnormal findings: Secondary | ICD-10-CM | POA: Diagnosis not present

## 2017-09-27 DIAGNOSIS — E786 Lipoprotein deficiency: Secondary | ICD-10-CM | POA: Diagnosis not present

## 2017-09-27 DIAGNOSIS — R739 Hyperglycemia, unspecified: Secondary | ICD-10-CM | POA: Diagnosis not present

## 2017-09-27 LAB — CBC WITH DIFFERENTIAL/PLATELET
BASOS PCT: 0.6 % (ref 0.0–3.0)
Basophils Absolute: 0 10*3/uL (ref 0.0–0.1)
Eosinophils Absolute: 0.1 10*3/uL (ref 0.0–0.7)
Eosinophils Relative: 2.6 % (ref 0.0–5.0)
HEMATOCRIT: 41.9 % (ref 39.0–52.0)
Hemoglobin: 14.6 g/dL (ref 13.0–17.0)
LYMPHS PCT: 45.6 % (ref 12.0–46.0)
Lymphs Abs: 2.4 10*3/uL (ref 0.7–4.0)
MCHC: 34.7 g/dL (ref 30.0–36.0)
MCV: 87.1 fl (ref 78.0–100.0)
MONOS PCT: 5.8 % (ref 3.0–12.0)
Monocytes Absolute: 0.3 10*3/uL (ref 0.1–1.0)
NEUTROS ABS: 2.4 10*3/uL (ref 1.4–7.7)
Neutrophils Relative %: 45.4 % (ref 43.0–77.0)
PLATELETS: 333 10*3/uL (ref 150.0–400.0)
RBC: 4.81 Mil/uL (ref 4.22–5.81)
RDW: 12.2 % (ref 11.5–15.5)
WBC: 5.2 10*3/uL (ref 4.0–10.5)

## 2017-09-27 LAB — LIPID PANEL
CHOL/HDL RATIO: 5
Cholesterol: 108 mg/dL (ref 0–200)
HDL: 23.1 mg/dL — AB (ref >39.00)
LDL Cholesterol: 62 mg/dL (ref 0–99)
NONHDL: 84.86
Triglycerides: 115 mg/dL (ref 0.0–149.0)
VLDL: 23 mg/dL (ref 0.0–40.0)

## 2017-09-27 LAB — COMPREHENSIVE METABOLIC PANEL
ALBUMIN: 4.3 g/dL (ref 3.5–5.2)
ALT: 47 U/L (ref 0–53)
AST: 21 U/L (ref 0–37)
Alkaline Phosphatase: 35 U/L — ABNORMAL LOW (ref 39–117)
BILIRUBIN TOTAL: 0.8 mg/dL (ref 0.2–1.2)
BUN: 11 mg/dL (ref 6–23)
CHLORIDE: 104 meq/L (ref 96–112)
CO2: 29 meq/L (ref 19–32)
Calcium: 9.5 mg/dL (ref 8.4–10.5)
Creatinine, Ser: 0.93 mg/dL (ref 0.40–1.50)
GFR: 94.17 mL/min (ref >60.00)
Glucose, Bld: 104 mg/dL — ABNORMAL HIGH (ref 70–99)
Potassium: 4 mEq/L (ref 3.5–5.1)
Sodium: 140 mEq/L (ref 135–145)
Total Protein: 6.8 g/dL (ref 6.0–8.3)

## 2017-09-27 LAB — TSH: TSH: 2.57 u[IU]/mL (ref 0.35–4.50)

## 2017-09-27 LAB — HEMOGLOBIN A1C: Hgb A1c MFr Bld: 6 % (ref 4.6–6.5)

## 2017-10-04 ENCOUNTER — Ambulatory Visit (INDEPENDENT_AMBULATORY_CARE_PROVIDER_SITE_OTHER): Payer: BC Managed Care – PPO | Admitting: Family Medicine

## 2017-10-04 ENCOUNTER — Encounter: Payer: Self-pay | Admitting: Family Medicine

## 2017-10-04 VITALS — BP 123/80 | HR 69 | Temp 98.2°F | Ht 68.5 in | Wt 197.8 lb

## 2017-10-04 DIAGNOSIS — I10 Essential (primary) hypertension: Secondary | ICD-10-CM

## 2017-10-04 DIAGNOSIS — R739 Hyperglycemia, unspecified: Secondary | ICD-10-CM

## 2017-10-04 DIAGNOSIS — E786 Lipoprotein deficiency: Secondary | ICD-10-CM

## 2017-10-04 DIAGNOSIS — Z0000 Encounter for general adult medical examination without abnormal findings: Secondary | ICD-10-CM

## 2017-10-04 MED ORDER — AMLODIPINE BESYLATE 5 MG PO TABS: 5.0000 mg | ORAL_TABLET | Freq: Every day | ORAL | 11 refills | Status: DC

## 2017-10-04 NOTE — Patient Instructions (Addendum)
Get your flu shot in the fall   To prevent diabetes Try to get most of your carbohydrates from produce (with the exception of white potatoes)  Eat less bread/pasta/rice/snack foods/cereals/sweets and other items from the middle of the grocery store (processed carbs)   Keep exercising  Continue fish oil and eat fish

## 2017-10-04 NOTE — Assessment & Plan Note (Signed)
Reviewed health habits including diet and exercise and skin cancer prevention Reviewed appropriate screening tests for age  Also reviewed health mt list, fam hx and immunization status , as well as social and family history   See HPI Labs rev  Enc flu shot in the fall

## 2017-10-04 NOTE — Assessment & Plan Note (Signed)
bp in fair control at this time  BP Readings from Last 1 Encounters:  10/04/17 123/80   No changes needed Most recent labs reviewed  Disc lifstyle change with low sodium diet and exercise

## 2017-10-04 NOTE — Assessment & Plan Note (Signed)
Lab Results  Component Value Date   HGBA1C 6.0 09/27/2017  he has DM in family disc imp of low glycemic diet and wt loss to prevent DM2

## 2017-10-04 NOTE — Progress Notes (Signed)
Subjective:    Patient ID: Darryl Jenkins, male    DOB: 04/17/1975, 42 y.o.   MRN: [id number]  HPI  Here for health maintenance exam and to review chronic medical problems   Feels much better from last visit  Took doxy for possible tick bourne illness  His labs were re assuring - but did take doxycycline     Wt Readings from Last 3 Encounters:  10/04/17 197 lb 12.8 oz (89.7 kg)  09/23/17 199 lb 8 oz (90.5 kg)  07/27/17 201 lb (91.2 kg)  wt is down 4 lb -has been walking a lot  (when son is on sports)  Eating healthy  29.64 kg/m   Colon cancer screening -colonoscopy 3/07  No family hx  No problems  He does take citrucel every am  Tries to eat fruits and vegetable    Flu shot - will get at work in October every year  Tetanus shot 3/11  Prostate health 0-2 nocturia if he drinks much fluid  No hesitancy and thinks he empties well  No family hx of prostate cancer    bp is stable today  No cp or palpitations or headaches or edema  No side effects to medicines  BP Readings from Last 3 Encounters:  10/04/17 (!) 138/98  09/23/17 132/82  07/27/17 118/82     Hyperlipidemia with low HDL  Lab Results  Component Value Date   CHOL 108 09/27/2017   CHOL 148 09/16/2016   CHOL 145 09/11/2015   Lab Results  Component Value Date   HDL 23.10 (L) 09/27/2017   HDL 35.70 (L) 09/16/2016   HDL 30.30 (L) 09/11/2015   Lab Results  Component Value Date   LDLCALC 62 09/27/2017   LDLCALC 96 09/16/2016   LDLCALC 97 09/11/2015   Lab Results  Component Value Date   TRIG 115.0 09/27/2017   TRIG 82.0 09/16/2016   TRIG 90.0 09/11/2015   Lab Results  Component Value Date   CHOLHDL 5 09/27/2017   CHOLHDL 4 09/16/2016   CHOLHDL 5 09/11/2015   No results found for: LDLDIRECT  Chronic low HDL even though he is exercising more  Takes fish oil daily  LDL is very low  Heart dz on fathers side of the family   Elevated glucose Lab Results  Component Value Date   HGBA1C 6.0  09/27/2017   Grandmother had DM  Brother- also mild diabetes    Lab Results  Component Value Date   CREATININE 0.93 09/27/2017   BUN 11 09/27/2017   NA 140 09/27/2017   K 4.0 09/27/2017   CL 104 09/27/2017   CO2 29 09/27/2017   Lab Results  Component Value Date   ALT 47 09/27/2017   AST 21 09/27/2017   ALKPHOS 35 (L) 09/27/2017   BILITOT 0.8 09/27/2017   Lab Results  Component Value Date   TSH 2.57 09/27/2017    Lab Results  Component Value Date   WBC 5.2 09/27/2017   HGB 14.6 09/27/2017   HCT 41.9 09/27/2017   MCV 87.1 09/27/2017   PLT 333.0 09/27/2017     Patient Active Problem List   Diagnosis Date Noted  . Colon cancer screening 05/24/2017  . Essential hypertension 05/24/2017  . Snoring 09/28/2016  . Somnolence 09/28/2016  . Hyperglycemia 09/16/2015  . Low HDL (under 40) 07/14/2010  . Routine general medical examination at a health care facility 07/10/2010  . GERD (gastroesophageal reflux disease) 03/27/2009   Past Medical History:  Diagnosis Date  .  Chest pain, unspecified   . Family history of ischemic heart disease   . Hemorrhoids   . Other malaise and fatigue   . Pneumonia    History reviewed. No pertinent surgical history. Social History   Tobacco Use  . Smoking status: Never Smoker  . Smokeless tobacco: Never Used  Substance Use Topics  . Alcohol use: Yes    Alcohol/week: 0.0 standard drinks    Comment: rare  . Drug use: No   Family History  Problem Relation Age of Onset  . Hypertension Brother   . Hypertension Brother   . Breast cancer Mother    Allergies  Allergen Reactions  . Iohexol      Code: RASH, Desc: pt developed rash post 100 ml omni 350 for cta coronary exam. steroid taper prescribed after exam was complete. bsw 03/21/2009., Onset Date: [id number]    Current Outpatient Medications on File Prior to Visit  Medication Sig Dispense Refill  . Multiple Vitamin (MULTIVITAMIN) capsule Take 1 capsule by mouth 2 (two) times  daily.     . Omega-3 Fatty Acids (FISH OIL) 1000 MG CAPS Take 1 capsule by mouth 2 (two) times daily.     . vitamin C (ASCORBIC ACID) 500 MG tablet Take 500 mg by mouth daily.      . [DISCONTINUED] omeprazole (PRILOSEC OTC) 20 MG tablet Take 1 tablet (20 mg total) by mouth every morning. 30 tablet 11   No current facility-administered medications on file prior to visit.     Review of Systems  Constitutional: Negative for activity change, appetite change, fatigue, fever and unexpected weight change.  HENT: Negative for congestion, rhinorrhea, sore throat and trouble swallowing.   Eyes: Negative for pain, redness, itching and visual disturbance.  Respiratory: Negative for cough, chest tightness, shortness of breath and wheezing.   Cardiovascular: Negative for chest pain and palpitations.  Gastrointestinal: Negative for abdominal pain, blood in stool, constipation, diarrhea and nausea.  Endocrine: Negative for cold intolerance, heat intolerance, polydipsia and polyuria.  Genitourinary: Negative for difficulty urinating, dysuria, frequency and urgency.  Musculoskeletal: Negative for arthralgias, joint swelling and myalgias.  Skin: Negative for pallor and rash.  Neurological: Negative for dizziness, tremors, weakness, numbness and headaches.  Hematological: Negative for adenopathy. Does not bruise/bleed easily.  Psychiatric/Behavioral: Negative for decreased concentration and dysphoric mood. The patient is not nervous/anxious.        Objective:   Physical Exam  Constitutional: He appears well-developed and well-nourished. No distress.  HENT:  Head: Normocephalic and atraumatic.  Right Ear: External ear normal.  Left Ear: External ear normal.  Nose: Nose normal.  Mouth/Throat: Oropharynx is clear and moist.  Eyes: Pupils are equal, round, and reactive to light. Conjunctivae and EOM are normal. Right eye exhibits no discharge. Left eye exhibits no discharge. No scleral icterus.  Neck:  Normal range of motion. Neck supple. No JVD present. Carotid bruit is not present. No thyromegaly present.  Cardiovascular: Normal rate, regular rhythm, normal heart sounds and intact distal pulses. Exam reveals no gallop.  Pulmonary/Chest: Effort normal and breath sounds normal. No respiratory distress. He has no wheezes. He exhibits no tenderness.  Abdominal: Soft. Bowel sounds are normal. He exhibits no distension, no abdominal bruit and no mass. There is no tenderness.  Musculoskeletal: He exhibits no edema or tenderness.  Lymphadenopathy:    He has no cervical adenopathy.  Neurological: He is alert. He has normal reflexes. No cranial nerve deficit. He exhibits normal muscle tone. Coordination normal.  Skin: Skin  is warm and dry. No rash noted. No erythema. No pallor.  Solar lentigines diffusely Mildly tanned   Psychiatric: He has a normal mood and affect.  Pleasant and talkative           Assessment & Plan:   Problem List Items Addressed This Visit      Cardiovascular and Mediastinum   Essential hypertension    bp in fair control at this time  BP Readings from Last 1 Encounters:  10/04/17 123/80   No changes needed Most recent labs reviewed  Disc lifstyle change with low sodium diet and exercise        Relevant Medications   amLODipine (NORVASC) 5 MG tablet     Other   Hyperglycemia    Lab Results  Component Value Date   HGBA1C 6.0 09/27/2017  he has DM in family disc imp of low glycemic diet and wt loss to prevent DM2       Low HDL (under 40)    Genetic low HDL Enc fish oil/fatty fish consumption and exercise Disc goals for lipids and reasons to control them Rev last labs with pt Rev low sat fat diet in detail       Routine general medical examination at a health care facility - Primary    Reviewed health habits including diet and exercise and skin cancer prevention Reviewed appropriate screening tests for age  Also reviewed health mt list, fam hx and  immunization status , as well as social and family history   See HPI Labs rev  Enc flu shot in the fall

## 2017-10-04 NOTE — Assessment & Plan Note (Signed)
Genetic low HDL Enc fish oil/fatty fish consumption and exercise Disc goals for lipids and reasons to control them Rev last labs with pt Rev low sat fat diet in detail

## 2018-01-10 ENCOUNTER — Ambulatory Visit: Payer: BC Managed Care – PPO | Admitting: Family Medicine

## 2018-01-10 ENCOUNTER — Encounter: Payer: Self-pay | Admitting: Family Medicine

## 2018-01-10 VITALS — BP 130/88 | HR 87 | Temp 98.1°F | Ht 68.5 in | Wt 209.5 lb

## 2018-01-10 DIAGNOSIS — J209 Acute bronchitis, unspecified: Secondary | ICD-10-CM | POA: Diagnosis not present

## 2018-01-10 DIAGNOSIS — R05 Cough: Secondary | ICD-10-CM | POA: Insufficient documentation

## 2018-01-10 DIAGNOSIS — R058 Other specified cough: Secondary | ICD-10-CM | POA: Insufficient documentation

## 2018-01-10 MED ORDER — HYDROCOD POLST-CPM POLST ER 10-8 MG/5ML PO SUER: 5.0000 mL | Freq: Every evening | ORAL | 0 refills | Status: DC | PRN

## 2018-01-10 MED ORDER — PROMETHAZINE-DM 6.25-15 MG/5ML PO SYRP: 5.0000 mL | ORAL_SOLUTION | Freq: Two times a day (BID) | ORAL | 0 refills | Status: DC | PRN

## 2018-01-10 NOTE — Assessment & Plan Note (Signed)
S/p tx from UC with prednisone and tessalon Cough remains persistent and bothersome  (likesly some post viral cough syndrome) Reassuring exam-no signs of bacterial infection  Px prometh DM for day time cough and tussionex for pm  Warned of sedation with both  Update if not starting to improve in a week or if worsening    Meds ordered this encounter  Medications  . chlorpheniramine-HYDROcodone (TUSSIONEX PENNKINETIC ER) 10-8 MG/5ML SUER    Sig: Take 5 mLs by mouth at bedtime as needed for cough.    Dispense:  140 mL    Refill:  0  . promethazine-dextromethorphan (PROMETHAZINE-DM) 6.25-15 MG/5ML syrup    Sig: Take 5 mLs by mouth 2 (two) times daily as needed for cough. During the day, caution of sedation    Dispense:  118 mL    Refill:  0

## 2018-01-10 NOTE — Progress Notes (Signed)
Subjective:    Patient ID: Darryl Jenkins, male    DOB: 12-20-1975, 42 y.o.   MRN: [id number]  HPI Here with symptoms of cough and congestion   Started coughing Tuesday night  Got worse out of town- [employer] diag at [facility], prednisone, albuterol   ? If helping  Cough is all dry  No production  No fever  A little achey Tired and not sleeping well   No nasal congestion  Some ST from cough  Some headache   Otc: mucinex and mucinex DM for the last week   No one else is sick / ? Where he got it   Patient Active Problem List   Diagnosis Date Noted  . Acute bronchitis 01/10/2018  . Colon cancer screening 05/24/2017  . Essential hypertension 05/24/2017  . Snoring 09/28/2016  . Somnolence 09/28/2016  . Hyperglycemia 09/16/2015  . Low HDL (under 40) 07/14/2010  . Routine general medical examination at a health care facility 07/10/2010  . GERD (gastroesophageal reflux disease) 03/27/2009   Past Medical History:  Diagnosis Date  . Chest pain, unspecified   . Family history of ischemic heart disease   . Hemorrhoids   . Other malaise and fatigue   . Pneumonia    History reviewed. No pertinent surgical history. Social History   Tobacco Use  . Smoking status: Never Smoker  . Smokeless tobacco: Never Used  Substance Use Topics  . Alcohol use: Yes    Alcohol/week: 0.0 standard drinks    Comment: rare  . Drug use: No   Family History  Problem Relation Age of Onset  . Hypertension Brother   . Hypertension Brother   . Breast cancer Mother    Allergies  Allergen Reactions  . Iohexol      Code: RASH, Desc: pt developed rash post 100 ml omni 350 for cta coronary exam. steroid taper prescribed after exam was complete. bsw 03/21/2009., Onset Date: [id number]    Current Outpatient Medications on File Prior to Visit  Medication Sig Dispense Refill  . albuterol (VENTOLIN HFA) 108 (90 Base) MCG/ACT inhaler 1-2 puffs every 4 (four) hours as needed.    Marland Kitchen amLODipine  (NORVASC) 5 MG tablet Take 1 tablet (5 mg total) by mouth daily. 30 tablet 11  . benzonatate (TESSALON) 100 MG capsule Take 1 capsule by mouth 3 (three) times daily as needed.    . Multiple Vitamin (MULTIVITAMIN) capsule Take 1 capsule by mouth 2 (two) times daily.     . Omega-3 Fatty Acids (FISH OIL) 1000 MG CAPS Take 1 capsule by mouth 2 (two) times daily.     . predniSONE (DELTASONE) 10 MG tablet     . vitamin C (ASCORBIC ACID) 500 MG tablet Take 500 mg by mouth daily.      . [DISCONTINUED] omeprazole (PRILOSEC OTC) 20 MG tablet Take 1 tablet (20 mg total) by mouth every morning. 30 tablet 11   No current facility-administered medications on file prior to visit.     Review of Systems  Constitutional: Positive for appetite change and fatigue. Negative for fever.  HENT: Positive for postnasal drip and sore throat. Negative for congestion, ear pain, rhinorrhea, sinus pressure, sneezing and voice change.   Eyes: Negative for pain and discharge.  Respiratory: Positive for cough and chest tightness. Negative for shortness of breath, wheezing and stridor.   Cardiovascular: Negative for chest pain.  Gastrointestinal: Negative for diarrhea, nausea and vomiting.  Genitourinary: Negative for frequency, hematuria and urgency.  Musculoskeletal: Negative for arthralgias and myalgias.  Skin: Negative for rash.  Neurological: Positive for headaches. Negative for dizziness, weakness and light-headedness.  Psychiatric/Behavioral: Negative for confusion and dysphoric mood.       Objective:   Physical Exam  Constitutional: He appears well-developed and well-nourished. No distress.  Well appearing   HENT:  Head: Normocephalic and atraumatic.  Right Ear: External ear normal.  Left Ear: External ear normal.  Mouth/Throat: Oropharynx is clear and moist.  Nares are boggy No sinus tenderness Clear rhinorrhea and post nasal drip   Eyes: Pupils are equal, round, and reactive to light. Conjunctivae and  EOM are normal. Right eye exhibits no discharge. Left eye exhibits no discharge.  Neck: Normal range of motion. Neck supple.  Cardiovascular: Normal rate and normal heart sounds.  Pulmonary/Chest: Effort normal and breath sounds normal. No stridor. No respiratory distress. He has no wheezes. He has no rales. He exhibits no tenderness.  Harsh bs  No rales or rhonchi Harsh sounding bronchial cough /loud and persistent  No sob  Lymphadenopathy:    He has no cervical adenopathy.  Neurological: He is alert. No cranial nerve deficit.  Skin: Skin is warm and dry. No rash noted.  Psychiatric: He has a normal mood and affect.          Assessment & Plan:   Problem List Items Addressed This Visit      Respiratory   Acute bronchitis - Primary    S/p tx from UC with prednisone and tessalon Cough remains persistent and bothersome  (likesly some post viral cough syndrome) Reassuring exam-no signs of bacterial infection  Px prometh DM for day time cough and tussionex for pm  Warned of sedation with both  Update if not starting to improve in a week or if worsening    Meds ordered this encounter  Medications  . chlorpheniramine-HYDROcodone (TUSSIONEX PENNKINETIC ER) 10-8 MG/5ML SUER    Sig: Take 5 mLs by mouth at bedtime as needed for cough.    Dispense:  140 mL    Refill:  0  . promethazine-dextromethorphan (PROMETHAZINE-DM) 6.25-15 MG/5ML syrup    Sig: Take 5 mLs by mouth 2 (two) times daily as needed for cough. During the day, caution of sedation    Dispense:  118 mL    Refill:  0

## 2018-01-10 NOTE — Patient Instructions (Signed)
Stop mucinex Continue prednisone  Drink lots of fluids   Continue tessalon as needed  Try prometh DM during the day  tussionex at night   Sleep propped up   If worse - productive cough, fever or trouble breathing-please call   Update if not starting to improve in a week or if worsening

## 2018-01-13 ENCOUNTER — Encounter: Payer: Self-pay | Admitting: Family Medicine

## 2018-01-13 ENCOUNTER — Ambulatory Visit (INDEPENDENT_AMBULATORY_CARE_PROVIDER_SITE_OTHER)
Admission: RE | Admit: 2018-01-13 | Discharge: 2018-01-13 | Disposition: A | Discharge status: Home / Self Care | Payer: BC Managed Care – PPO | Source: Ambulatory Visit | Attending: Family Medicine | Admitting: Family Medicine

## 2018-01-13 DIAGNOSIS — J209 Acute bronchitis, unspecified: Secondary | ICD-10-CM

## 2018-01-13 NOTE — Telephone Encounter (Signed)
Chest xray ordered

## 2018-01-13 NOTE — Telephone Encounter (Signed)
Pt coming in for xray today per Dr. Milinda Antisower

## 2018-01-14 ENCOUNTER — Ambulatory Visit: Payer: BC Managed Care – PPO | Admitting: Family Medicine

## 2018-01-14 ENCOUNTER — Encounter: Payer: Self-pay | Admitting: Family Medicine

## 2018-01-14 VITALS — BP 126/90 | HR 79 | Temp 98.4°F | Ht 68.5 in | Wt 211.0 lb

## 2018-01-14 DIAGNOSIS — J209 Acute bronchitis, unspecified: Secondary | ICD-10-CM | POA: Diagnosis not present

## 2018-01-14 MED ORDER — PREDNISONE 20 MG PO TABS: ORAL_TABLET | ORAL | 0 refills | Status: DC

## 2018-01-14 MED ORDER — AZITHROMYCIN 250 MG PO TABS: ORAL_TABLET | ORAL | 0 refills | Status: DC

## 2018-01-14 NOTE — Patient Instructions (Signed)
DM cough medicine-day tussionex-night  Tessalon - three times daily   zpak - as directed 60 mg prednisone taper- as directed   Update if not starting to improve in a week or if worsening   Shortness of breath/wheezing/ more prod cough

## 2018-01-14 NOTE — Progress Notes (Signed)
Subjective:    Patient ID: Darryl Jenkins, male    DOB: 1975-11-19, 42 y.o.   MRN: [id number]  HPI Here for re check of uri/bronchitis  cxr yesterday was re assuring Dg Chest 2 View  Result Date: 01/13/2018 CLINICAL DATA:  bronchitis- worsening cough. Nonsmoker. EXAM: CHEST - 2 VIEW COMPARISON:  11/09/2008 FINDINGS: There is mild prominence of central interstitial markings and perihilar peribronchial thickening. There are no focal consolidations or pleural effusions. No pulmonary edema. Visualized osseous structures have a normal appearance. IMPRESSION: Bronchitic changes.  No focal acute pulmonary abnormality. Electronically Signed   By: Norva Pavlov M.D.   On: 01/13/2018 13:57    Tessalon tussionex  Prev on prednisone  Offered abx-chose to f/u first   Was doing a bit better around Wednesday Worse yesterday- very bad cough (dry and barky/ wheezy in nature)  Gets winded during cough fits Is sore from coughing   The albuterol inhaler helps a bit   Does hear himself wheezing  Hears crackling   Patient Active Problem List   Diagnosis Date Noted  . Acute bronchitis 01/10/2018  . Colon cancer screening 05/24/2017  . Essential hypertension 05/24/2017  . Snoring 09/28/2016  . Somnolence 09/28/2016  . Hyperglycemia 09/16/2015  . Low HDL (under 40) 07/14/2010  . Routine general medical examination at a health care facility 07/10/2010  . GERD (gastroesophageal reflux disease) 03/27/2009   Past Medical History:  Diagnosis Date  . Chest pain, unspecified   . Family history of ischemic heart disease   . Hemorrhoids   . Other malaise and fatigue   . Pneumonia    History reviewed. No pertinent surgical history. Social History   Tobacco Use  . Smoking status: Never Smoker  . Smokeless tobacco: Never Used  Substance Use Topics  . Alcohol use: Yes    Alcohol/week: 0.0 standard drinks    Comment: rare  . Drug use: No   Family History  Problem Relation Age of Onset  .  Hypertension Brother   . Hypertension Brother   . Breast cancer Mother    Allergies  Allergen Reactions  . Iohexol      Code: RASH, Desc: pt developed rash post 100 ml omni 350 for cta coronary exam. steroid taper prescribed after exam was complete. bsw 03/21/2009., Onset Date: [id number]    Current Outpatient Medications on File Prior to Visit  Medication Sig Dispense Refill  . albuterol (VENTOLIN HFA) 108 (90 Base) MCG/ACT inhaler 1-2 puffs every 4 (four) hours as needed.    Marland Kitchen amLODipine (NORVASC) 5 MG tablet Take 1 tablet (5 mg total) by mouth daily. 30 tablet 11  . chlorpheniramine-HYDROcodone (TUSSIONEX PENNKINETIC ER) 10-8 MG/5ML SUER Take 5 mLs by mouth at bedtime as needed for cough. 140 mL 0  . Multiple Vitamin (MULTIVITAMIN) capsule Take 1 capsule by mouth 2 (two) times daily.     . Omega-3 Fatty Acids (FISH OIL) 1000 MG CAPS Take 1 capsule by mouth 2 (two) times daily.     . promethazine-dextromethorphan (PROMETHAZINE-DM) 6.25-15 MG/5ML syrup Take 5 mLs by mouth 2 (two) times daily as needed for cough. During the day, caution of sedation 118 mL 0  . vitamin C (ASCORBIC ACID) 500 MG tablet Take 500 mg by mouth daily.      . [DISCONTINUED] omeprazole (PRILOSEC OTC) 20 MG tablet Take 1 tablet (20 mg total) by mouth every morning. 30 tablet 11   No current facility-administered medications on file prior to visit.  Review of Systems  Constitutional: Positive for fatigue. Negative for activity change, appetite change, fever and unexpected weight change.  HENT: Positive for postnasal drip. Negative for congestion, rhinorrhea, sore throat and trouble swallowing.   Eyes: Negative for pain, redness, itching and visual disturbance.  Respiratory: Positive for cough, chest tightness and wheezing. Negative for shortness of breath and stridor.        Chest wall soreness/pain from cough  Cardiovascular: Negative for chest pain and palpitations.  Gastrointestinal: Negative for abdominal pain,  blood in stool, constipation, diarrhea and nausea.  Endocrine: Negative for cold intolerance, heat intolerance, polydipsia and polyuria.  Genitourinary: Negative for difficulty urinating, dysuria, frequency and urgency.  Musculoskeletal: Negative for arthralgias, joint swelling and myalgias.  Skin: Negative for pallor and rash.  Neurological: Negative for dizziness, tremors, weakness, numbness and headaches.  Hematological: Negative for adenopathy. Does not bruise/bleed easily.  Psychiatric/Behavioral: Negative for decreased concentration and dysphoric mood. The patient is not nervous/anxious.        Objective:   Physical Exam  Constitutional: He appears well-developed and well-nourished. No distress.  Well appearing Persistent barky cough   HENT:  Head: Normocephalic and atraumatic.  Right Ear: External ear normal.  Left Ear: External ear normal.  Mouth/Throat: Oropharynx is clear and moist.  Nares are boggy No sinus tenderness Clear rhinorrhea and post nasal drip   Eyes: Pupils are equal, round, and reactive to light. Conjunctivae and EOM are normal. Right eye exhibits no discharge. Left eye exhibits no discharge.  Neck: Normal range of motion. Neck supple.  Cardiovascular: Normal rate and normal heart sounds.  Pulmonary/Chest: Effort normal and breath sounds normal. No stridor. No respiratory distress. He has no wheezes. He has no rales. He exhibits no tenderness.  Harsh bs No rales or rhonchi Some upper airway sounds Wheeze on forced exp only   Overall good air exch  Lymphadenopathy:    He has no cervical adenopathy.  Neurological: He is alert.  Skin: Skin is warm and dry. No rash noted.  Psychiatric: He has a normal mood and affect.          Assessment & Plan:   Problem List Items Addressed This Visit      Respiratory   Acute bronchitis - Primary    Reassuring cxr but cough continues zpak px due to length of illness Continue aggressive cough control    Prednisone taper 60 mg for reactive airways/inflammation  Update if not starting to improve in a week or if worsening

## 2018-01-16 NOTE — Assessment & Plan Note (Signed)
Reassuring cxr but cough continues zpak px due to length of illness Continue aggressive cough control  Prednisone taper 60 mg for reactive airways/inflammation  Update if not starting to improve in a week or if worsening

## 2018-01-17 ENCOUNTER — Encounter: Payer: Self-pay | Admitting: Family Medicine

## 2018-01-26 ENCOUNTER — Encounter: Payer: Self-pay | Admitting: Family Medicine

## 2018-01-28 ENCOUNTER — Encounter: Payer: Self-pay | Admitting: Family Medicine

## 2018-01-28 ENCOUNTER — Ambulatory Visit: Payer: BC Managed Care – PPO | Admitting: Family Medicine

## 2018-01-28 VITALS — BP 112/74 | HR 81 | Temp 98.5°F | Ht 68.5 in | Wt 208.0 lb

## 2018-01-28 DIAGNOSIS — R058 Other specified cough: Secondary | ICD-10-CM

## 2018-01-28 DIAGNOSIS — R05 Cough: Secondary | ICD-10-CM | POA: Diagnosis not present

## 2018-01-28 MED ORDER — HYDROCOD POLST-CPM POLST ER 10-8 MG/5ML PO SUER: 5.0000 mL | Freq: Two times a day (BID) | ORAL | 0 refills | Status: DC | PRN

## 2018-01-28 MED ORDER — FAMOTIDINE 20 MG PO TABS: 20.0000 mg | ORAL_TABLET | Freq: Two times a day (BID) | ORAL | 1 refills | Status: DC

## 2018-01-28 MED ORDER — BENZONATATE 200 MG PO CAPS: 200.0000 mg | ORAL_CAPSULE | Freq: Three times a day (TID) | ORAL | 1 refills | Status: DC | PRN

## 2018-01-28 NOTE — Patient Instructions (Addendum)
Take an antihistamine- claritin or allegra or zytec daily -for runny nose and cough  Continue tessalon three times daily  Also tussionex - twice daily (careful of sedation)  Drink lots of fluids pepcid (keeps stomach calm) twice daily   Update if not starting to improve in a week or if worsening   Fever/productive cough or new symptoms- also let us know

## 2018-01-28 NOTE — Progress Notes (Signed)
Subjective:    Patient ID: Darryl Jenkins, male    DOB: [birth year], 42 y.o.   MRN: [id number]  HPI Here for ongoing cough after uri/bronchitis  Last A/P on 12/6 Acute bronchitis - Primary     Reassuring cxr but cough continues zpak px due to length of illness Continue aggressive cough control  Prednisone taper 60 mg for reactive airways/inflammation  Update if not starting to improve in a week or if worsening      He had been using tessalon and tussionex and had a prior course of prednisone as well   Wt Readings from Last 3 Encounters:  01/28/18 208 lb (94.3 kg)  01/14/18 211 lb (95.7 kg)  01/10/18 209 lb 8 oz (95 kg)     Dg Chest 2 View  Result Date: 01/13/2018 CLINICAL DATA:  bronchitis- worsening cough. Nonsmoker. EXAM: CHEST - 2 VIEW COMPARISON:  11/09/2008 FINDINGS: There is mild prominence of central interstitial markings and perihilar peribronchial thickening. There are no focal consolidations or pleural effusions. No pulmonary edema. Visualized osseous structures have a normal appearance. IMPRESSION: Bronchitic changes.  No focal acute pulmonary abnormality. Electronically Signed   By: Norva Pavlov M.D.   On: 01/13/2018 13:57    More nasal symptoms since yesterday  No fever (felt cold last night)  May be getting a cold   4 weeks of cough  Deep barky cough  Using tessalon pearles 200 mg  tussionex at night (that helps)  prometh dm during the day - sedating  And inhaler   Has 2 weeks off around the holiday   Feels congestion /irritation in chest/throat area  No heartburn  Patient Active Problem List   Diagnosis Date Noted  . 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  . Colon cancer screening 05/24/2017  . Essential hypertension 05/24/2017  . Snoring 09/28/2016  . Somnolence 09/28/2016  . Hyperglycemia 09/16/2015  . Low HDL (under 40) 07/14/2010  . Routine general medical examination at a health care facility 07/10/2010  . GERD (gastroesophageal reflux  disease) 03/27/2009   Past Medical History:  Diagnosis Date  . Chest pain, unspecified   . Family history of ischemic heart disease   . Hemorrhoids   . Other malaise and fatigue   . Pneumonia    History reviewed. No pertinent surgical history. Social History   Tobacco Use  . Smoking status: Never Smoker  . Smokeless tobacco: Never Used  Substance Use Topics  . Alcohol use: Yes    Alcohol/week: 0.0 standard drinks    Comment: rare  . Drug use: No   Family History  Problem Relation Age of Onset  . Hypertension Brother   . Hypertension Brother   . Breast cancer Mother    Allergies  Allergen Reactions  . Iohexol      Code: RASH, Desc: pt developed rash post 100 ml omni 350 for cta coronary exam. steroid taper prescribed after exam was complete. bsw 03/21/2009., Onset Date: [id number]    Current Outpatient Medications on File Prior to Visit  Medication Sig Dispense Refill  . albuterol (VENTOLIN HFA) 108 (90 Base) MCG/ACT inhaler 1-2 puffs every 4 (four) hours as needed.    Marland Kitchen amLODipine (NORVASC) 5 MG tablet Take 1 tablet (5 mg total) by mouth daily. 30 tablet 11  . Multiple Vitamin (MULTIVITAMIN) capsule Take 1 capsule by mouth 2 (two) times daily.     . Omega-3 Fatty Acids (FISH OIL) 1000 MG CAPS Take 1 capsule by mouth 2 (two) times daily.     Marland Kitchen  vitamin C (ASCORBIC ACID) 500 MG tablet Take 500 mg by mouth daily.      . [DISCONTINUED] omeprazole (PRILOSEC OTC) 20 MG tablet Take 1 tablet (20 mg total) by mouth every morning. 30 tablet 11   No current facility-administered medications on file prior to visit.      Review of Systems  Constitutional: Negative for activity change, appetite change, fatigue, fever and unexpected weight change.  HENT: Negative for congestion, rhinorrhea, sore throat and trouble swallowing.   Eyes: Negative for pain, redness, itching and visual disturbance.  Respiratory: Positive for cough. Negative for apnea, choking, chest tightness, shortness of  breath, wheezing and stridor.   Cardiovascular: Negative for chest pain and palpitations.  Gastrointestinal: Negative for abdominal pain, blood in stool, constipation, diarrhea and nausea.  Endocrine: Negative for cold intolerance, heat intolerance, polydipsia and polyuria.  Genitourinary: Negative for difficulty urinating, dysuria, frequency and urgency.  Musculoskeletal: Negative for arthralgias, joint swelling and myalgias.  Skin: Negative for pallor and rash.  Neurological: Negative for dizziness, tremors, weakness, numbness and headaches.  Hematological: Negative for adenopathy. Does not bruise/bleed easily.  Psychiatric/Behavioral: Negative for decreased concentration and dysphoric mood. The patient is not nervous/anxious.        Objective:   Physical Exam Constitutional:      General: He is not in acute distress.    Appearance: Normal appearance. He is well-developed and normal weight. He is not ill-appearing or diaphoretic.  HENT:     Head: Normocephalic and atraumatic.  Eyes:     Conjunctiva/sclera: Conjunctivae normal.     Pupils: Pupils are equal, round, and reactive to light.  Neck:     Musculoskeletal: Normal range of motion and neck supple.     Thyroid: No thyromegaly.     Vascular: No carotid bruit or JVD.  Cardiovascular:     Rate and Rhythm: Normal rate and regular rhythm.     Heart sounds: Normal heart sounds. No gallop.   Pulmonary:     Effort: Pulmonary effort is normal. No respiratory distress.     Breath sounds: Normal breath sounds. No stridor. No wheezing, rhonchi or rales.     Comments: Good air exch No rales or rhonchi  No wheeze even on forced exp   No crackles  Chest:     Chest wall: No tenderness.  Abdominal:     General: Bowel sounds are normal. There is no distension or abdominal bruit.     Palpations: Abdomen is soft. There is no mass.     Tenderness: There is no abdominal tenderness.  Lymphadenopathy:     Cervical: No cervical adenopathy.    Skin:    General: Skin is warm and dry.     Findings: No rash.  Neurological:     Mental Status: He is alert.     Deep Tendon Reflexes: Reflexes are normal and symmetric.  Psychiatric:        Mood and Affect: Mood normal.           Assessment & Plan:   Problem List Items Addressed This Visit      Respiratory   Post-viral cough syndrome - Primary    Reassuring exam and cxr last  Will be more aggressive with cough (able to take tussionex atc now that he has time off for holiday), and also H2 blocker in case acid reflux adds to this  AVS: Take an antihistamine- claritin or allegra or zytec daily -for runny nose and cough  Continue tessalon three times  daily  Also tussionex - twice daily (careful of sedation)  Drink lots of fluids pepcid (keeps stomach calm) twice daily   Update if not starting to improve in a week or if worsening   Fever/productive cough or new symptoms- also let us know

## 2018-01-30 NOTE — Assessment & Plan Note (Signed)
Reassuring exam and cxr last  Will be more aggressive with cough (able to take tussionex atc now that he has time off for holiday), and also H2 blocker in case acid reflux adds to this  AVS: Take an antihistamine- claritin or allegra or zytec daily -for runny nose and cough  Continue tessalon three times daily  Also tussionex - twice daily (careful of sedation)  Drink lots of fluids pepcid (keeps stomach calm) twice daily   Update if not starting to improve in a week or if worsening   Fever/productive cough or new symptoms- also let us know

## 2018-02-01 NOTE — Telephone Encounter (Signed)
Called [facility] and gave the verbal override to fill Rx since it's new directions, please send mychart message letting pt know what to do regarding all his sxs, thanks

## 2018-10-02 ENCOUNTER — Telehealth: Payer: Self-pay | Admitting: Family Medicine

## 2018-10-02 DIAGNOSIS — I10 Essential (primary) hypertension: Secondary | ICD-10-CM

## 2018-10-02 DIAGNOSIS — R7303 Prediabetes: Secondary | ICD-10-CM

## 2018-10-02 DIAGNOSIS — Z0000 Encounter for general adult medical examination without abnormal findings: Secondary | ICD-10-CM

## 2018-10-02 DIAGNOSIS — E786 Lipoprotein deficiency: Secondary | ICD-10-CM

## 2018-10-02 NOTE — Telephone Encounter (Signed)
-----   Message from Ellamae Sia sent at 09/26/2018  1:37 PM EDT ----- Regarding: Lab orders for Monday, 8.24.20 Patient is scheduled for CPX labs, please order future labs, Thanks , Karna Christmas

## 2018-10-03 ENCOUNTER — Other Ambulatory Visit: Payer: Self-pay

## 2018-10-03 ENCOUNTER — Other Ambulatory Visit (INDEPENDENT_AMBULATORY_CARE_PROVIDER_SITE_OTHER): Payer: BC Managed Care – PPO

## 2018-10-03 DIAGNOSIS — E786 Lipoprotein deficiency: Secondary | ICD-10-CM | POA: Diagnosis not present

## 2018-10-03 DIAGNOSIS — R7303 Prediabetes: Secondary | ICD-10-CM

## 2018-10-03 DIAGNOSIS — I10 Essential (primary) hypertension: Secondary | ICD-10-CM | POA: Diagnosis not present

## 2018-10-03 LAB — LIPID PANEL
Cholesterol: 165 mg/dL (ref 0–200)
HDL: 31.5 mg/dL — ABNORMAL LOW (ref >39.00)
LDL Cholesterol: 108 mg/dL — ABNORMAL HIGH (ref 0–99)
NonHDL: 133.71
Total CHOL/HDL Ratio: 5
Triglycerides: 128 mg/dL (ref 0.0–149.0)
VLDL: 25.6 mg/dL (ref 0.0–40.0)

## 2018-10-03 LAB — HEMOGLOBIN A1C: Hgb A1c MFr Bld: 5.8 % (ref 4.6–6.5)

## 2018-10-03 LAB — COMPREHENSIVE METABOLIC PANEL
ALT: 26 U/L (ref 0–53)
AST: 21 U/L (ref 0–37)
Albumin: 4.9 g/dL (ref 3.5–5.2)
Alkaline Phosphatase: 33 U/L — ABNORMAL LOW (ref 39–117)
BUN: 10 mg/dL (ref 6–23)
CO2: 26 mEq/L (ref 19–32)
Calcium: 9.4 mg/dL (ref 8.4–10.5)
Chloride: 103 mEq/L (ref 96–112)
Creatinine, Ser: 0.88 mg/dL (ref 0.40–1.50)
GFR: 93.99 mL/min (ref >60.00)
Glucose, Bld: 107 mg/dL — ABNORMAL HIGH (ref 70–99)
Potassium: 4.2 mEq/L (ref 3.5–5.1)
Sodium: 138 mEq/L (ref 135–145)
Total Bilirubin: 1.5 mg/dL — ABNORMAL HIGH (ref 0.2–1.2)
Total Protein: 7 g/dL (ref 6.0–8.3)

## 2018-10-03 LAB — CBC WITH DIFFERENTIAL/PLATELET
Basophils Absolute: 0 10*3/uL (ref 0.0–0.1)
Basophils Relative: 1 % (ref 0.0–3.0)
Eosinophils Absolute: 0.1 10*3/uL (ref 0.0–0.7)
Eosinophils Relative: 2 % (ref 0.0–5.0)
HCT: 42.7 % (ref 39.0–52.0)
Hemoglobin: 14.9 g/dL (ref 13.0–17.0)
Lymphocytes Relative: 37.3 % (ref 12.0–46.0)
Lymphs Abs: 1.8 10*3/uL (ref 0.7–4.0)
MCHC: 34.8 g/dL (ref 30.0–36.0)
MCV: 88.8 fl (ref 78.0–100.0)
Monocytes Absolute: 0.4 10*3/uL (ref 0.1–1.0)
Monocytes Relative: 9.1 % (ref 3.0–12.0)
Neutro Abs: 2.5 10*3/uL (ref 1.4–7.7)
Neutrophils Relative %: 50.6 % (ref 43.0–77.0)
Platelets: 320 10*3/uL (ref 150.0–400.0)
RBC: 4.81 Mil/uL (ref 4.22–5.81)
RDW: 12.5 % (ref 11.5–15.5)
WBC: 4.9 10*3/uL (ref 4.0–10.5)

## 2018-10-04 LAB — TSH: TSH: 4.14 u[IU]/mL (ref 0.35–4.50)

## 2018-10-07 ENCOUNTER — Encounter: Payer: Self-pay | Admitting: Family Medicine

## 2018-10-07 ENCOUNTER — Other Ambulatory Visit: Payer: Self-pay

## 2018-10-07 ENCOUNTER — Ambulatory Visit (INDEPENDENT_AMBULATORY_CARE_PROVIDER_SITE_OTHER): Payer: BC Managed Care – PPO | Admitting: Family Medicine

## 2018-10-07 VITALS — BP 122/85 | HR 68 | Temp 97.9°F | Ht 68.75 in | Wt 205.0 lb

## 2018-10-07 DIAGNOSIS — I10 Essential (primary) hypertension: Secondary | ICD-10-CM | POA: Diagnosis not present

## 2018-10-07 DIAGNOSIS — Z23 Encounter for immunization: Secondary | ICD-10-CM | POA: Diagnosis not present

## 2018-10-07 DIAGNOSIS — E786 Lipoprotein deficiency: Secondary | ICD-10-CM | POA: Diagnosis not present

## 2018-10-07 DIAGNOSIS — Z0000 Encounter for general adult medical examination without abnormal findings: Secondary | ICD-10-CM

## 2018-10-07 DIAGNOSIS — R7303 Prediabetes: Secondary | ICD-10-CM

## 2018-10-07 DIAGNOSIS — Z1211 Encounter for screening for malignant neoplasm of colon: Secondary | ICD-10-CM

## 2018-10-07 MED ORDER — AMLODIPINE BESYLATE 5 MG PO TABS: 5.0000 mg | ORAL_TABLET | Freq: Every day | ORAL | 11 refills | Status: DC

## 2018-10-07 NOTE — Progress Notes (Signed)
Subjective:    Patient ID: Darryl Jenkins, male    DOB: 1975/03/14, 43 y.o.   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    Weight  Wt Readings from Last 3 Encounters:  10/07/18 205 lb (93 kg)  01/28/18 208 lb (94.3 kg)  01/14/18 211 lb (95.7 kg)  down 3 lb since dec  He is walking 2-3 miles per day  30.49 kg/m   Colonoscopy 2007  Does not need another one until 49   Flu shot -will get today    Td 3/11  Prostate health  Nocturia is usually one time per night (worse if drinking liquids at night)  No trouble with stream or emptying  No prostate cancer in family   HTN bp is up on first check today No cp or palpitations or headaches or edema  No side effects to medicines  BP Readings from Last 3 Encounters:  10/07/18 (!) 130/96  01/28/18 112/74  01/14/18 126/90    Has not taken his medicine this am -usually takes it around 8  Taking omeprazole for gerd- had to get back on it for heartburn    Lipids  Lab Results  Component Value Date   CHOL 165 10/03/2018   CHOL 108 09/27/2017   CHOL 148 09/16/2016   Lab Results  Component Value Date   HDL 31.50 (L) 10/03/2018   HDL 23.10 (L) 09/27/2017   HDL 35.70 (L) 09/16/2016   Lab Results  Component Value Date   LDLCALC 108 (H) 10/03/2018   LDLCALC 62 09/27/2017   LDLCALC 96 09/16/2016   Lab Results  Component Value Date   TRIG 128.0 10/03/2018   TRIG 115.0 09/27/2017   TRIG 82.0 09/16/2016   Lab Results  Component Value Date   CHOLHDL 5 10/03/2018   CHOLHDL 5 09/27/2017   CHOLHDL 4 09/16/2016   No results found for: LDLDIRECT H/o low HDL  Takes fish oil 1200 mg  This is improved with walking  LDL is also up - eating a little better at home (genetic ? ) More vegetables from the garden  Pizza once per week  Has cut way back on fast food  Eating more fish (not shellfish)      Prediabetes Lab  Results  Component Value Date   HGBA1C 5.8 10/03/2018  this is down from 6.0   Changed jobs a year ago  Sleep is better/quality of sleep is great and he feels rested  Lab Results  Component Value Date   CREATININE 0.88 10/03/2018   BUN 10 10/03/2018   NA 138 10/03/2018   K 4.2 10/03/2018   CL 103 10/03/2018   CO2 26 10/03/2018   Lab Results  Component Value Date   WBC 4.9 10/03/2018   HGB 14.9 10/03/2018   HCT 42.7 10/03/2018   MCV 88.8 10/03/2018   PLT 320.0 10/03/2018   Lab Results  Component Value Date   ALT 26 10/03/2018   AST 21 10/03/2018   ALKPHOS 33 (L) 10/03/2018   BILITOT 1.5 (H) 10/03/2018    Lab Results  Component Value Date   TSH 4.14 10/03/2018    Patient Active Problem List   Diagnosis Date Noted  . Colon cancer screening 05/24/2017  . Essential hypertension 05/24/2017  . Prediabetes 09/16/2015  . Low HDL (under 40) 07/14/2010  . Routine general medical examination at a health care facility  07/10/2010  . GERD (gastroesophageal reflux disease) 03/27/2009   Past Medical History:  Diagnosis Date  . Chest pain, unspecified   . Family history of ischemic heart disease   . Hemorrhoids   . Other malaise and fatigue   . Pneumonia    History reviewed. No pertinent surgical history. Social History   Tobacco Use  . Smoking status: Never Smoker  . Smokeless tobacco: Never Used  Substance Use Topics  . Alcohol use: Yes    Alcohol/week: 0.0 standard drinks    Comment: rare  . Drug use: No   Family History  Problem Relation Age of Onset  . Hypertension Brother   . Hypertension Brother   . Breast cancer Mother    Allergies  Allergen Reactions  . Iohexol      Code: RASH, Desc: pt developed rash post 100 ml omni 350 for cta coronary exam. steroid taper prescribed after exam was complete. bsw 03/21/2009., Onset Date: [id number]    Current Outpatient Medications on File Prior to Visit  Medication Sig Dispense Refill  . Multiple Vitamin  (MULTIVITAMIN) capsule Take 1 capsule by mouth 2 (two) times daily.     . Omega-3 Fatty Acids (FISH OIL) 1000 MG CAPS Take 1 capsule by mouth 2 (two) times daily.     Marland Kitchen omeprazole (PRILOSEC) 20 MG capsule Take 20 mg by mouth daily as needed.    . vitamin C (ASCORBIC ACID) 500 MG tablet Take 500 mg by mouth daily.      . [DISCONTINUED] omeprazole (PRILOSEC OTC) 20 MG tablet Take 1 tablet (20 mg total) by mouth every morning. 30 tablet 11   No current facility-administered medications on file prior to visit.     Review of Systems  Constitutional: Negative for activity change, appetite change, fatigue, fever and unexpected weight change.  HENT: Negative for congestion, rhinorrhea, sore throat and trouble swallowing.   Eyes: Negative for pain, redness, itching and visual disturbance.  Respiratory: Negative for cough, chest tightness, shortness of breath and wheezing.   Cardiovascular: Negative for chest pain and palpitations.  Gastrointestinal: Negative for abdominal pain, blood in stool, constipation, diarrhea and nausea.  Endocrine: Negative for cold intolerance, heat intolerance, polydipsia and polyuria.  Genitourinary: Negative for difficulty urinating, dysuria, frequency and urgency.  Musculoskeletal: Negative for arthralgias, joint swelling and myalgias.  Skin: Negative for pallor and rash.  Neurological: Negative for dizziness, tremors, weakness, numbness and headaches.  Hematological: Negative for adenopathy. Does not bruise/bleed easily.  Psychiatric/Behavioral: Negative for decreased concentration and dysphoric mood. The patient is not nervous/anxious.        Objective:   Physical Exam Constitutional:      General: He is not in acute distress.    Appearance: Normal appearance. He is well-developed. He is obese. He is not ill-appearing or diaphoretic.  HENT:     Head: Normocephalic and atraumatic.     Right Ear: Tympanic membrane, ear canal and external ear normal.     Left Ear:  Tympanic membrane, ear canal and external ear normal.     Nose: Nose normal. No congestion.     Mouth/Throat:     Mouth: Mucous membranes are moist.     Pharynx: Oropharynx is clear. No posterior oropharyngeal erythema.  Eyes:     General: No scleral icterus.       Right eye: No discharge.        Left eye: No discharge.     Conjunctiva/sclera: Conjunctivae normal.     Pupils: Pupils are  equal, round, and reactive to light.  Neck:     Musculoskeletal: Normal range of motion and neck supple. No neck rigidity or muscular tenderness.     Thyroid: No thyromegaly.     Vascular: No carotid bruit or JVD.  Cardiovascular:     Rate and Rhythm: Normal rate and regular rhythm.     Pulses: Normal pulses.     Heart sounds: Normal heart sounds. No gallop.   Pulmonary:     Effort: Pulmonary effort is normal. No respiratory distress.     Breath sounds: Normal breath sounds. No wheezing or rales.     Comments: Good air exch Chest:     Chest wall: No tenderness.  Abdominal:     General: Bowel sounds are normal. There is no distension or abdominal bruit.     Palpations: Abdomen is soft. There is no mass.     Tenderness: There is no abdominal tenderness.     Hernia: No hernia is present.  Musculoskeletal:        General: No tenderness.     Right lower leg: No edema.     Left lower leg: No edema.     Comments: No acute joint changes  Muscular build  Lymphadenopathy:     Cervical: No cervical adenopathy.  Skin:    General: Skin is warm and dry.     Coloration: Skin is not pale.     Findings: No erythema or rash.     Comments: Solar lentigines diffusely   Neurological:     Mental Status: He is alert.     Cranial Nerves: No cranial nerve deficit.     Motor: No abnormal muscle tone.     Coordination: Coordination normal.     Gait: Gait normal.     Deep Tendon Reflexes: Reflexes are normal and symmetric. Reflexes normal.  Psychiatric:        Mood and Affect: Mood normal.        Cognition  and Memory: Cognition normal.           Assessment & Plan:   Problem List Items Addressed This Visit      Cardiovascular and Mediastinum   Essential hypertension    bp in fair control at this time  BP Readings from Last 1 Encounters:  10/07/18 122/85   No changes needed Most recent labs reviewed  Disc lifstyle change with low sodium diet and exercise        Relevant Medications   amLODipine (NORVASC) 5 MG tablet     Other   Routine general medical examination at a health care facility - Primary    Reviewed health habits including diet and exercise and skin cancer prevention Reviewed appropriate screening tests for age  Also reviewed health mt list, fam hx and immunization status , as well as social and family history   See HPI Labs rev Disc diet for cholesterol Enc him to continue exercise  Flu shot today        Relevant Orders   Flu Vaccine QUAD 6+ mos PF IM (Fluarix Quad PF) (Completed)   Low HDL (under 40)    HDL up to 31 this visit  Disc goal of 40 or higher  Continues fish oil  Also exercise  LDL up a bit to 108  Disc goals for lipids and reasons to control them Rev last labs with pt Rev low sat fat diet in detail       Prediabetes    Lab Results  Component Value Date   HGBA1C 5.8 10/03/2018   disc imp of low glycemic diet and wt loss to prevent DM2       Colon cancer screening    Pt had colonoscopy in 2007 Next one due at 49        Other Visit Diagnoses    Need for influenza vaccination       Relevant Orders   Flu Vaccine QUAD 6+ mos PF IM (Fluarix Quad PF) (Completed)

## 2018-10-07 NOTE — Patient Instructions (Addendum)
Keep exercising  Continue fish oil Avoid trans fats the best you can  Avoid red meat/ fried foods/ egg yolks/ fatty breakfast meats/ butter, cheese and high fat dairy/ and shellfish    I'm glad you are doing well  Flu shot today

## 2018-10-09 NOTE — Assessment & Plan Note (Signed)
HDL up to 31 this visit  Disc goal of 40 or higher  Continues fish oil  Also exercise  LDL up a bit to 108  Disc goals for lipids and reasons to control them Rev last labs with pt Rev low sat fat diet in detail

## 2018-10-09 NOTE — Assessment & Plan Note (Signed)
Pt had colonoscopy in 2007 Next one due at 49

## 2018-10-09 NOTE — Assessment & Plan Note (Signed)
Reviewed health habits including diet and exercise and skin cancer prevention Reviewed appropriate screening tests for age  Also reviewed health mt list, fam hx and immunization status , as well as social and family history   See HPI Labs rev Disc diet for cholesterol Enc him to continue exercise  Flu shot today

## 2018-10-09 NOTE — Assessment & Plan Note (Signed)
bp in fair control at this time  BP Readings from Last 1 Encounters:  10/07/18 122/85   No changes needed Most recent labs reviewed  Disc lifstyle change with low sodium diet and exercise

## 2018-10-09 NOTE — Assessment & Plan Note (Signed)
Lab Results  Component Value Date   HGBA1C 5.8 10/03/2018   disc imp of low glycemic diet and wt loss to prevent DM2

## 2019-02-23 IMAGING — DX DG CHEST 2V
2 series · 2 of 2 positions shown · non-contrast
Comparison: 11/09/2008

CLINICAL DATA: bronchitis- worsening cough. Nonsmoker.

EXAM:
CHEST - 2 VIEW

[chest pa]
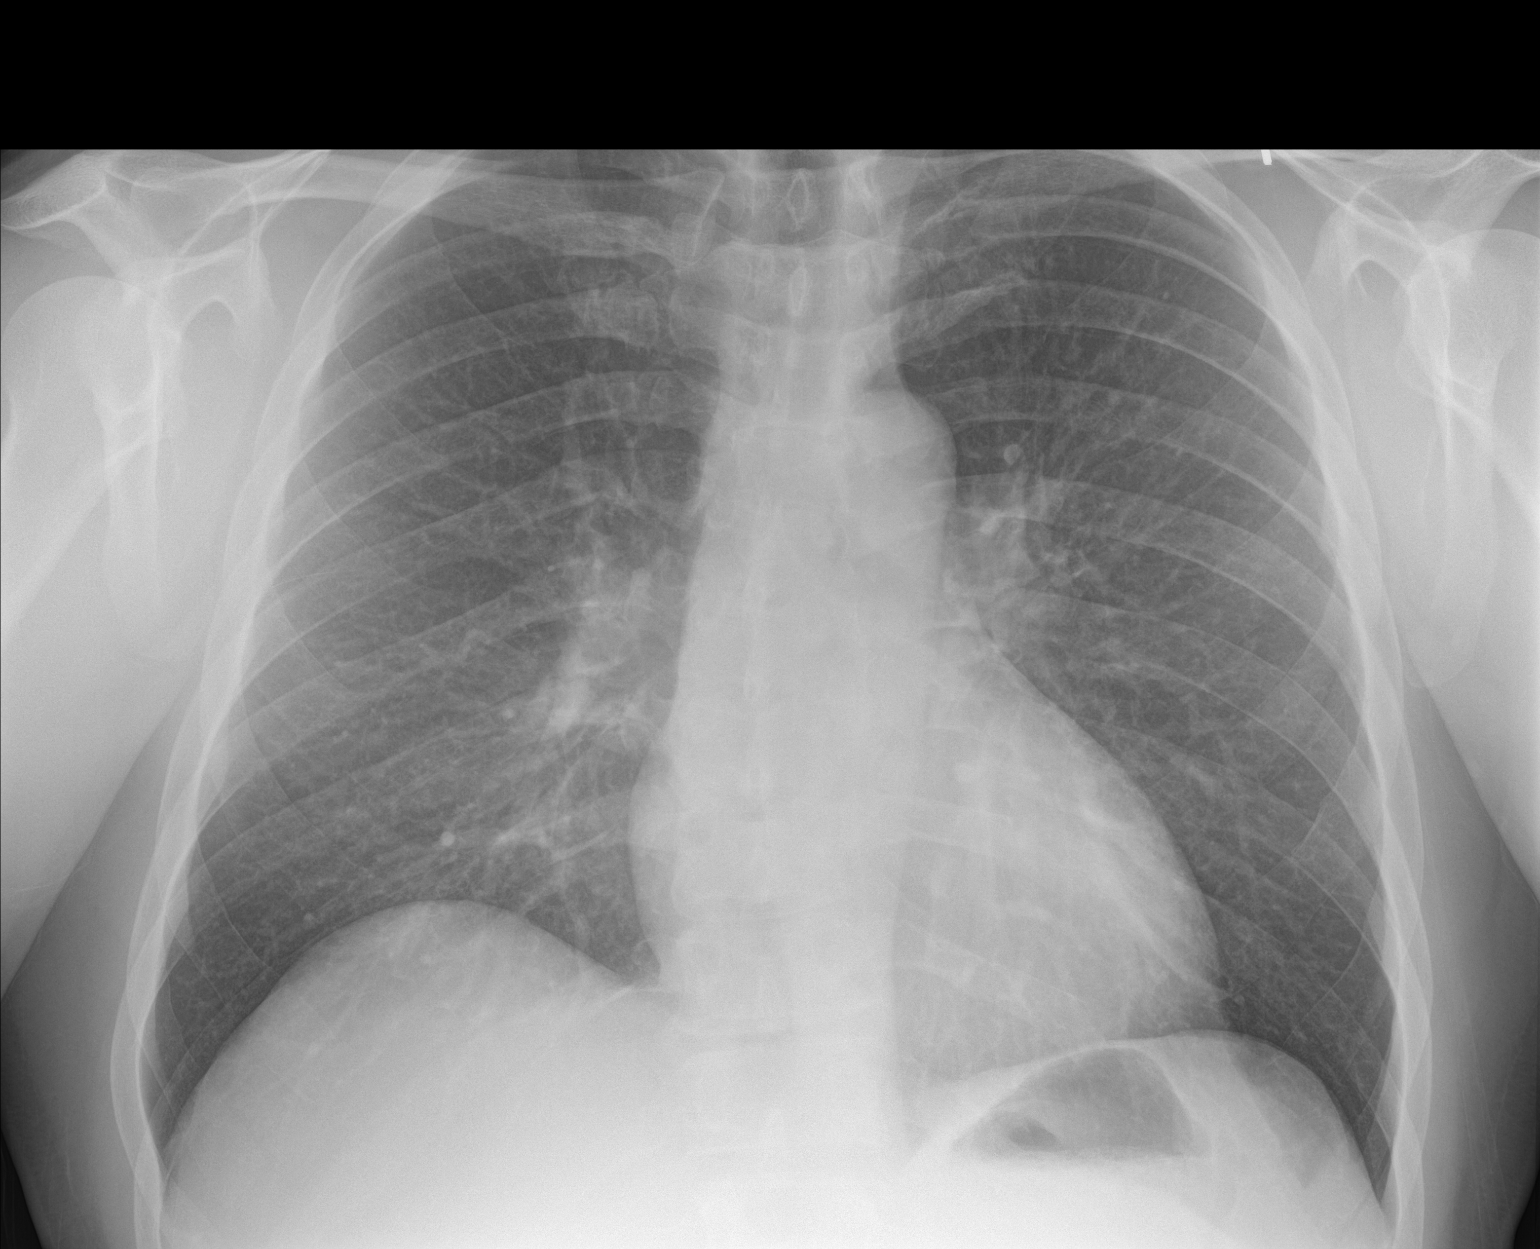

[chest lat]
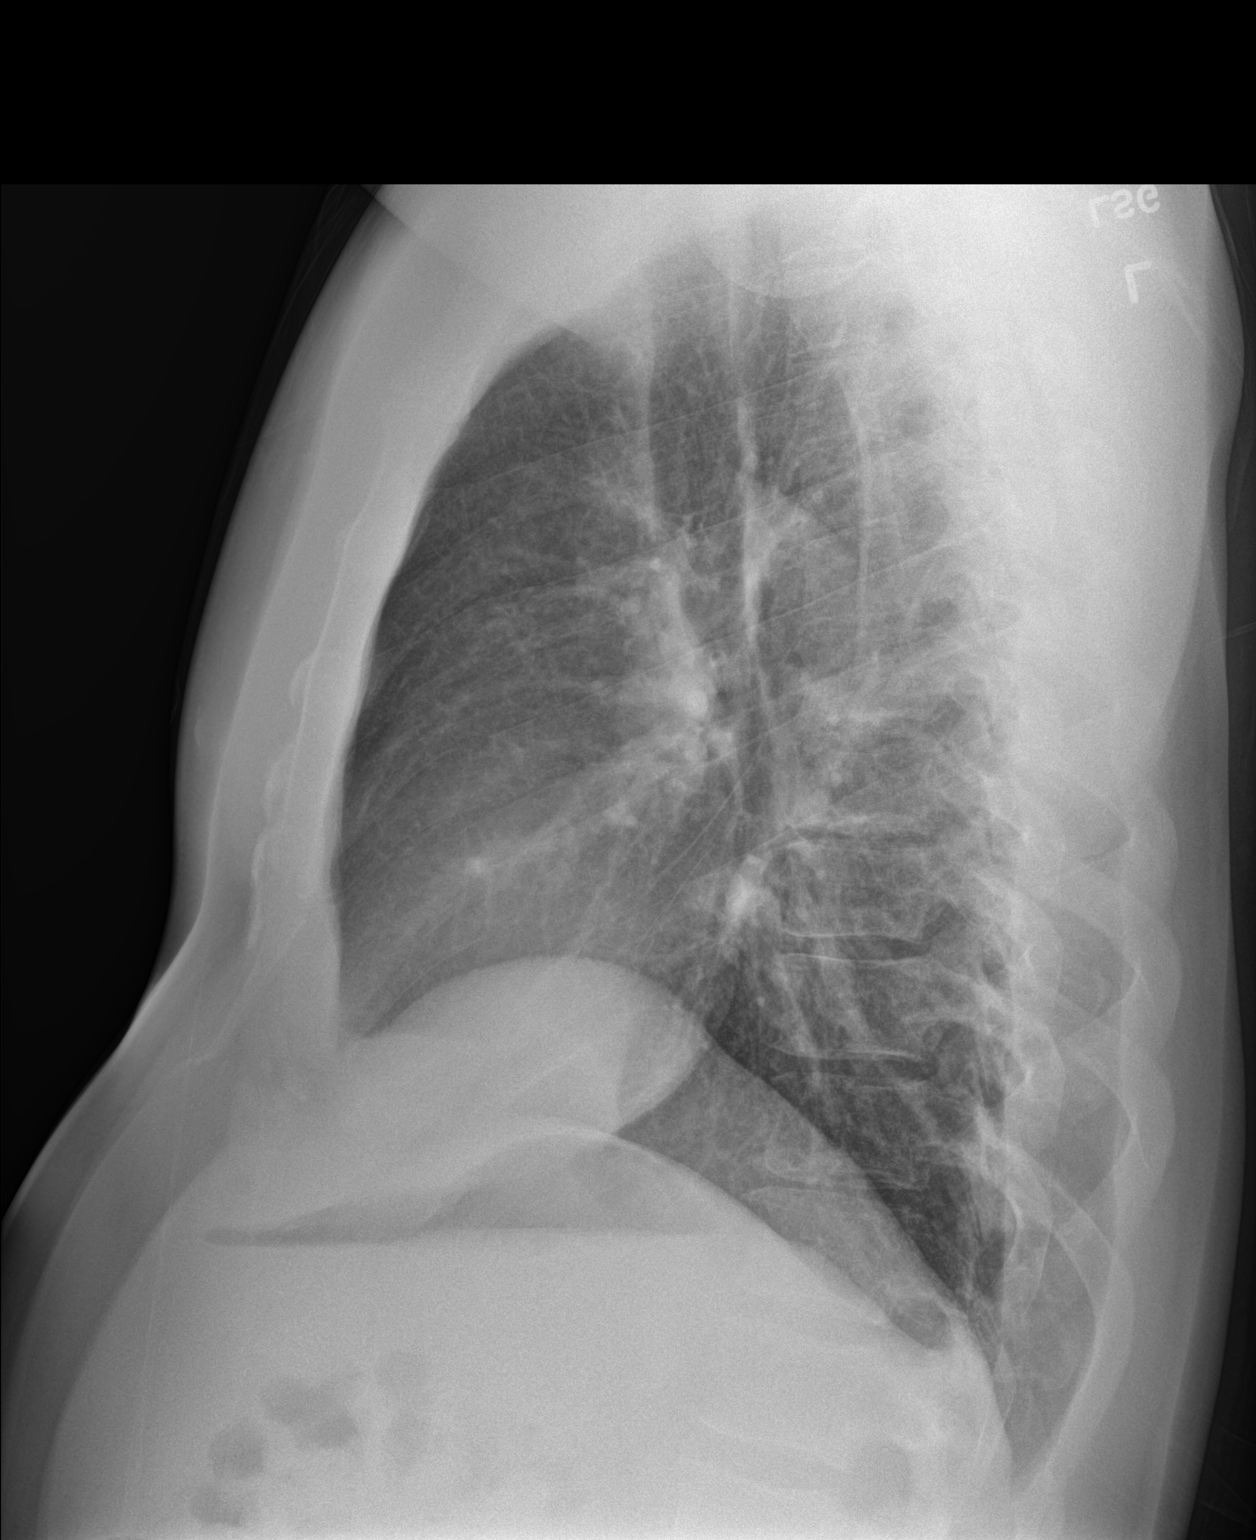

[2 of 2 positions shown; findings below may reference images not displayed]

FINDINGS: There is mild prominence of central interstitial markings and
perihilar peribronchial thickening. There are no focal
consolidations or pleural effusions. No pulmonary edema. Visualized
osseous structures have a normal appearance.
IMPRESSION: Bronchitic changes.  No focal acute pulmonary abnormality.

## 2019-10-03 ENCOUNTER — Telehealth: Payer: Self-pay | Admitting: Family Medicine

## 2019-10-03 DIAGNOSIS — I10 Essential (primary) hypertension: Secondary | ICD-10-CM

## 2019-10-03 DIAGNOSIS — E786 Lipoprotein deficiency: Secondary | ICD-10-CM

## 2019-10-03 DIAGNOSIS — R7303 Prediabetes: Secondary | ICD-10-CM

## 2019-10-03 NOTE — Telephone Encounter (Signed)
-----   Message from Alvina Chou sent at 09/19/2019  2:31 PM EDT ----- Regarding: lab orders for Friday,8.27.21 Patient is scheduled for CPX labs, please order future labs, Thanks , Camelia Eng

## 2019-10-06 ENCOUNTER — Other Ambulatory Visit (INDEPENDENT_AMBULATORY_CARE_PROVIDER_SITE_OTHER): Payer: BC Managed Care – PPO

## 2019-10-06 ENCOUNTER — Other Ambulatory Visit: Payer: Self-pay

## 2019-10-06 DIAGNOSIS — E786 Lipoprotein deficiency: Secondary | ICD-10-CM | POA: Diagnosis not present

## 2019-10-06 DIAGNOSIS — I10 Essential (primary) hypertension: Secondary | ICD-10-CM | POA: Diagnosis not present

## 2019-10-06 DIAGNOSIS — R7303 Prediabetes: Secondary | ICD-10-CM

## 2019-10-06 LAB — CBC WITH DIFFERENTIAL/PLATELET
Basophils Absolute: 0 10*3/uL (ref 0.0–0.1)
Basophils Relative: 1 % (ref 0.0–3.0)
Eosinophils Absolute: 0.1 10*3/uL (ref 0.0–0.7)
Eosinophils Relative: 1.5 % (ref 0.0–5.0)
HCT: 44 % (ref 39.0–52.0)
Hemoglobin: 15.2 g/dL (ref 13.0–17.0)
Lymphocytes Relative: 33.1 % (ref 12.0–46.0)
Lymphs Abs: 1.7 10*3/uL (ref 0.7–4.0)
MCHC: 34.6 g/dL (ref 30.0–36.0)
MCV: 88.4 fl (ref 78.0–100.0)
Monocytes Absolute: 0.4 10*3/uL (ref 0.1–1.0)
Monocytes Relative: 8.5 % (ref 3.0–12.0)
Neutro Abs: 2.8 10*3/uL (ref 1.4–7.7)
Neutrophils Relative %: 55.9 % (ref 43.0–77.0)
Platelets: 308 10*3/uL (ref 150.0–400.0)
RBC: 4.98 Mil/uL (ref 4.22–5.81)
RDW: 12.8 % (ref 11.5–15.5)
WBC: 5 10*3/uL (ref 4.0–10.5)

## 2019-10-06 LAB — COMPREHENSIVE METABOLIC PANEL
ALT: 20 U/L (ref 0–53)
AST: 17 U/L (ref 0–37)
Albumin: 4.9 g/dL (ref 3.5–5.2)
Alkaline Phosphatase: 34 U/L — ABNORMAL LOW (ref 39–117)
BUN: 17 mg/dL (ref 6–23)
CO2: 28 mEq/L (ref 19–32)
Calcium: 9.8 mg/dL (ref 8.4–10.5)
Chloride: 101 mEq/L (ref 96–112)
Creatinine, Ser: 0.94 mg/dL (ref 0.40–1.50)
GFR: 86.7 mL/min (ref >60.00)
Glucose, Bld: 102 mg/dL — ABNORMAL HIGH (ref 70–99)
Potassium: 4.5 mEq/L (ref 3.5–5.1)
Sodium: 136 mEq/L (ref 135–145)
Total Bilirubin: 1.8 mg/dL — ABNORMAL HIGH (ref 0.2–1.2)
Total Protein: 7.3 g/dL (ref 6.0–8.3)

## 2019-10-06 LAB — LIPID PANEL
Cholesterol: 155 mg/dL (ref 0–200)
HDL: 33.8 mg/dL — ABNORMAL LOW (ref >39.00)
LDL Cholesterol: 105 mg/dL — ABNORMAL HIGH (ref 0–99)
NonHDL: 121.11
Total CHOL/HDL Ratio: 5
Triglycerides: 80 mg/dL (ref 0.0–149.0)
VLDL: 16 mg/dL (ref 0.0–40.0)

## 2019-10-06 LAB — HEMOGLOBIN A1C: Hgb A1c MFr Bld: 5.8 % (ref 4.6–6.5)

## 2019-10-06 LAB — TSH: TSH: 3.19 u[IU]/mL (ref 0.35–4.50)

## 2019-10-09 ENCOUNTER — Ambulatory Visit (INDEPENDENT_AMBULATORY_CARE_PROVIDER_SITE_OTHER): Payer: BC Managed Care – PPO | Admitting: Family Medicine

## 2019-10-09 ENCOUNTER — Encounter: Payer: Self-pay | Admitting: Family Medicine

## 2019-10-09 ENCOUNTER — Other Ambulatory Visit: Payer: Self-pay

## 2019-10-09 VITALS — BP 128/80 | HR 78 | Temp 97.5°F | Ht 68.75 in | Wt 204.2 lb

## 2019-10-09 DIAGNOSIS — I10 Essential (primary) hypertension: Secondary | ICD-10-CM | POA: Diagnosis not present

## 2019-10-09 DIAGNOSIS — Z0000 Encounter for general adult medical examination without abnormal findings: Secondary | ICD-10-CM

## 2019-10-09 DIAGNOSIS — Z1211 Encounter for screening for malignant neoplasm of colon: Secondary | ICD-10-CM

## 2019-10-09 DIAGNOSIS — E786 Lipoprotein deficiency: Secondary | ICD-10-CM | POA: Diagnosis not present

## 2019-10-09 DIAGNOSIS — Z23 Encounter for immunization: Secondary | ICD-10-CM | POA: Diagnosis not present

## 2019-10-09 DIAGNOSIS — R7303 Prediabetes: Secondary | ICD-10-CM

## 2019-10-09 MED ORDER — AMLODIPINE BESYLATE 5 MG PO TABS
5.0000 mg | ORAL_TABLET | Freq: Every day | ORAL | 11 refills | Status: DC
Start: 2019-10-09 — End: 2020-10-09

## 2019-10-09 NOTE — Assessment & Plan Note (Signed)
Taking omega 3 supplement and walking  HDL up to 33.8  LDL down to 105  Disc goals for lipids and reasons to control them Rev last labs with pt Rev low sat fat diet in detail

## 2019-10-09 NOTE — Assessment & Plan Note (Signed)
bp in fair control at this time  BP Readings from Last 1 Encounters:  10/09/19 128/80   No changes needed-continues amlodipine 5mg  daily Most recent labs reviewed  Disc lifstyle change with low sodium diet and exercise

## 2019-10-09 NOTE — Progress Notes (Signed)
Subjective:    Patient ID: Darryl Jenkins, male    DOB: [birth year], 44 y.o.   MRN: [id number]  This visit occurred during the SARS-CoV-2 public health emergency.  Safety protocols were in place, including screening questions prior to the visit, additional usage of staff PPE, and extensive cleaning of exam room while observing appropriate contact time as indicated for disinfecting solutions.    HPI Here for health maintenance exam and to review chronic medical problems    Wt Readings from Last 3 Encounters:  10/09/19 204 lb 3 oz (92.6 kg)  10/07/18 205 lb (93 kg)  01/28/18 208 lb (94.3 kg)   30.37 kg/m  covid status- not immunized  Thinks he may have had covid in 2019 before we knew what it was  He is hesitant -thinking about it  Is afraid of reactions to it    Lost his mom in may- to breast cancer  Doing ok overall  Doing ok with grief - faith is a good coping mechanism and lots of support  (lost dad when he was 57)  Brothers are helping with the paperwork/legal   Taking care of himself  Waking - at least 2-3 mi per day  Still plays softball in spring and fall   From dec to April- lost 12 lb eating better /cut out sugar  Then after his mom got sick he stopped  Now trying to get back to it best he can    Td 3/11 Will update today  Flu shot - will get in the fall   HTN bp is stable today  No cp or palpitations or headaches or edema  No side effects to medicines  BP Readings from Last 3 Encounters:  10/09/19 (!) 148/94  10/07/18 122/85  01/28/18 112/74     Pulse Readings from Last 3 Encounters:  10/09/19 78  10/07/18 68  01/28/18 81   Colon cancer screening  Had colonoscopy in 2007 -was told next one at 49    Hyperlipidemia Lab Results  Component Value Date   CHOL 155 10/06/2019   CHOL 165 10/03/2018   CHOL 108 09/27/2017   Lab Results  Component Value Date   HDL 33.80 (L) 10/06/2019   HDL 31.50 (L) 10/03/2018   HDL 23.10 (L) 09/27/2017   Lab  Results  Component Value Date   LDLCALC 105 (H) 10/06/2019   LDLCALC 108 (H) 10/03/2018   LDLCALC 62 09/27/2017   Lab Results  Component Value Date   TRIG 80.0 10/06/2019   TRIG 128.0 10/03/2018   TRIG 115.0 09/27/2017   Lab Results  Component Value Date   CHOLHDL 5 10/06/2019   CHOLHDL 5 10/03/2018   CHOLHDL 5 09/27/2017   No results found for: LDLDIRECT   Low HDL - it went up a few points  LDL is down to 105  Takes fish oil   Prediabetes Lab Results  Component Value Date   HGBA1C 5.8 10/06/2019  glucose 102 last check    Lab Results  Component Value Date   CREATININE 0.94 10/06/2019   BUN 17 10/06/2019   NA 136 10/06/2019   K 4.5 10/06/2019   CL 101 10/06/2019   CO2 28 10/06/2019   Lab Results  Component Value Date   ALT 20 10/06/2019   AST 17 10/06/2019   ALKPHOS 34 (L) 10/06/2019   BILITOT 1.8 (H) 10/06/2019   Lab Results  Component Value Date   WBC 5.0 10/06/2019   HGB 15.2 10/06/2019  HCT 44.0 10/06/2019   MCV 88.4 10/06/2019   PLT 308.0 10/06/2019   Lab Results  Component Value Date   TSH 3.19 10/06/2019    Patient Active Problem List   Diagnosis Date Noted  . Colon cancer screening 05/24/2017  . Essential hypertension 05/24/2017  . Prediabetes 09/16/2015  . Low HDL (under 40) 07/14/2010  . Routine general medical examination at a health care facility 07/10/2010  . GERD (gastroesophageal reflux disease) 03/27/2009   Past Medical History:  Diagnosis Date  . Chest pain, unspecified   . Family history of ischemic heart disease   . Hemorrhoids   . Other malaise and fatigue   . Pneumonia    History reviewed. No pertinent surgical history. Social History   Tobacco Use  . Smoking status: Never Smoker  . Smokeless tobacco: Never Used  Substance Use Topics  . Alcohol use: Yes    Alcohol/week: 0.0 standard drinks    Comment: rare  . Drug use: No   Family History  Problem Relation Age of Onset  . Hypertension Brother   .  Hypertension Brother   . Breast cancer Mother    Allergies  Allergen Reactions  . Iohexol      Code: RASH, Desc: pt developed rash post 100 ml omni 350 for cta coronary exam. steroid taper prescribed after exam was complete. bsw 03/21/2009., Onset Date: [id number]    Current Outpatient Medications on File Prior to Visit  Medication Sig Dispense Refill  . Cholecalciferol (VITAMIN D3 PO) Take 2,000 Units by mouth daily.    . Multiple Vitamin (MULTIVITAMIN) capsule Take 1 capsule by mouth 2 (two) times daily.     . Omega-3 Fatty Acids (FISH OIL) 1000 MG CAPS Take 1 capsule by mouth 2 (two) times daily.     Marland Kitchen omeprazole (PRILOSEC) 20 MG capsule Take 20 mg by mouth daily as needed.    . vitamin C (ASCORBIC ACID) 500 MG tablet Take 500 mg by mouth daily.      . [DISCONTINUED] omeprazole (PRILOSEC OTC) 20 MG tablet Take 1 tablet (20 mg total) by mouth every morning. 30 tablet 11   No current facility-administered medications on file prior to visit.     Review of Systems  Constitutional: Negative for activity change, appetite change, fatigue, fever and unexpected weight change.  HENT: Negative for congestion, rhinorrhea, sore throat and trouble swallowing.   Eyes: Negative for pain, redness, itching and visual disturbance.  Respiratory: Negative for cough, chest tightness, shortness of breath and wheezing.   Cardiovascular: Negative for chest pain and palpitations.  Gastrointestinal: Negative for abdominal pain, blood in stool, constipation, diarrhea and nausea.  Endocrine: Negative for cold intolerance, heat intolerance, polydipsia and polyuria.  Genitourinary: Negative for difficulty urinating, dysuria, frequency and urgency.  Musculoskeletal: Negative for arthralgias, joint swelling and myalgias.  Skin: Negative for pallor and rash.  Neurological: Negative for dizziness, tremors, weakness, numbness and headaches.  Hematological: Negative for adenopathy. Does not bruise/bleed easily.    Psychiatric/Behavioral: Negative for decreased concentration and dysphoric mood. The patient is not nervous/anxious.        Grief reaction        Objective:   Physical Exam Constitutional:      General: He is not in acute distress.    Appearance: Normal appearance. He is well-developed. He is obese. He is not ill-appearing or diaphoretic.  HENT:     Head: Normocephalic and atraumatic.     Right Ear: Tympanic membrane, ear canal and external ear  normal.     Left Ear: Tympanic membrane, ear canal and external ear normal.     Nose: Nose normal. No congestion.     Mouth/Throat:     Mouth: Mucous membranes are moist.     Pharynx: Oropharynx is clear. No posterior oropharyngeal erythema.  Eyes:     General: No scleral icterus.       Right eye: No discharge.        Left eye: No discharge.     Conjunctiva/sclera: Conjunctivae normal.     Pupils: Pupils are equal, round, and reactive to light.  Neck:     Thyroid: No thyromegaly.     Vascular: No carotid bruit or JVD.  Cardiovascular:     Rate and Rhythm: Normal rate and regular rhythm.     Pulses: Normal pulses.     Heart sounds: Normal heart sounds. No gallop.   Pulmonary:     Effort: Pulmonary effort is normal. No respiratory distress.     Breath sounds: Normal breath sounds. No wheezing or rales.     Comments: Good air exch Chest:     Chest wall: No tenderness.  Abdominal:     General: Bowel sounds are normal. There is no distension or abdominal bruit.     Palpations: Abdomen is soft. There is no mass.     Tenderness: There is no abdominal tenderness.     Hernia: No hernia is present.  Musculoskeletal:        General: No tenderness.     Cervical back: Normal range of motion and neck supple. No rigidity. No muscular tenderness.     Right lower leg: No edema.     Left lower leg: No edema.  Lymphadenopathy:     Cervical: No cervical adenopathy.  Skin:    General: Skin is warm and dry.     Coloration: Skin is not pale.      Findings: No erythema or rash.     Comments: Solar lentigines diffusely   Neurological:     Mental Status: He is alert.     Cranial Nerves: No cranial nerve deficit.     Motor: No abnormal muscle tone.     Coordination: Coordination normal.     Gait: Gait normal.     Deep Tendon Reflexes: Reflexes are normal and symmetric. Reflexes normal.  Psychiatric:        Mood and Affect: Mood normal.        Cognition and Memory: Cognition and memory normal.           Assessment & Plan:   Problem List Items Addressed This Visit      Cardiovascular and Mediastinum   Essential hypertension    bp in fair control at this time  BP Readings from Last 1 Encounters:  10/09/19 128/80   No changes needed-continues amlodipine 5mg  daily Most recent labs reviewed  Disc lifstyle change with low sodium diet and exercise        Relevant Medications   amLODipine (NORVASC) 5 MG tablet     Other   Routine general medical examination at a health care facility - Primary    Reviewed health habits including diet and exercise and skin cancer prevention Reviewed appropriate screening tests for age  Also reviewed health mt list, fam hx and immunization status , as well as social and family history   See HPI Labs reviewed Updated Td Encouraged to consider covid vaccine and answered questions  Candidate for colonoscopy if ins covers  Relevant Orders   Td : Tetanus/diphtheria >7yo Preservative  free (Completed)   Low HDL (under 40)    Taking omega 3 supplement and walking  HDL up to 33.8  LDL down to 105  Disc goals for lipids and reasons to control them Rev last labs with pt Rev low sat fat diet in detail       Prediabetes    Lab Results  Component Value Date   HGBA1C 5.8 10/06/2019   Stable disc imp of low glycemic diet and wt loss to prevent DM2  He plans to get back to that soon       Colon cancer screening    Colonoscopy 2007 Was going to do next at age 44 , now  lowered screening age to [age] so inst to check with insurance co re: coverage and let us know if he would like to do that earlier       Other Visit Diagnoses    Need for Td vaccine       Relevant Orders   Td : Tetanus/diphtheria >7yo Preservative  free (Completed)

## 2019-10-09 NOTE — Assessment & Plan Note (Signed)
Reviewed health habits including diet and exercise and skin cancer prevention Reviewed appropriate screening tests for age  Also reviewed health mt list, fam hx and immunization status , as well as social and family history   See HPI Labs reviewed Updated Td Encouraged to consider covid vaccine and answered questions  Candidate for colonoscopy if ins covers

## 2019-10-09 NOTE — Assessment & Plan Note (Signed)
Colonoscopy 2007 Was going to do next at age 44 , now lowered screening age to 44 so inst to check with insurance co re: coverage and let us know if he would like to do that earlier

## 2019-10-09 NOTE — Assessment & Plan Note (Signed)
Lab Results  Component Value Date   HGBA1C 5.8 10/06/2019   Stable disc imp of low glycemic diet and wt loss to prevent DM2  He plans to get back to that soon

## 2019-10-09 NOTE — Patient Instructions (Addendum)
Take care of yourself   Try to get most of your carbohydrates from produce (with the exception of white potatoes)  Eat less bread/pasta/rice/snack foods/cereals/sweets and other items from the middle of the grocery store (processed carbs)  Keep walking  Add some exercise at home    No change in medicines   Hang in there   Tetanus shot today

## 2020-01-08 ENCOUNTER — Encounter: Payer: Self-pay | Admitting: Family Medicine

## 2020-01-08 ENCOUNTER — Telehealth (INDEPENDENT_AMBULATORY_CARE_PROVIDER_SITE_OTHER): Payer: BC Managed Care – PPO | Admitting: Family Medicine

## 2020-01-08 DIAGNOSIS — J069 Acute upper respiratory infection, unspecified: Secondary | ICD-10-CM | POA: Diagnosis not present

## 2020-01-08 MED ORDER — FLUTICASONE PROPIONATE 50 MCG/ACT NA SUSP: 2.0000 | Freq: Every day | NASAL | 6 refills | Status: DC

## 2020-01-08 MED ORDER — BENZONATATE 200 MG PO CAPS: 200.0000 mg | ORAL_CAPSULE | Freq: Three times a day (TID) | ORAL | 1 refills | Status: DC | PRN

## 2020-01-08 MED ORDER — ALBUTEROL SULFATE HFA 108 (90 BASE) MCG/ACT IN AERS: 2.0000 | INHALATION_SPRAY | RESPIRATORY_TRACT | 0 refills | Status: DC | PRN

## 2020-01-08 NOTE — Progress Notes (Signed)
Virtual Visit via Video Note  I connected with Darryl Jenkins on 01/08/20 at  2:30 PM EST by a video enabled telemedicine application and verified that I am speaking with the correct person using two identifiers.  Location: Patient: home Provider: office   I discussed the limitations of evaluation and management by telemedicine and the availability of in person appointments. The patient expressed understanding and agreed to proceed.  Parties involved in encounter  Patient:Darryl Jenkins   Provider:  Roxy Manns MD    History of Present Illness: Pt presents with uri symptoms for about a week  He tested negative for covid   A lot of pnd/throat congestion  L ear -hearing is muffled   A little chest congestion  No fever  No nausea  No rash  No change in taste and smell     Nasal congestion- thick yellow   Cough is productive / just a little wheezing  He usually gets wheezing with a cough  Does not have an inhaler  No sinus pain / no facial tenderness    otc mucinex fast max (works well for the first 2 hours)  Has decongestant and DM   Patient Active Problem List   Diagnosis Date Noted  . Colon cancer screening 05/24/2017  . Essential hypertension 05/24/2017  . Prediabetes 09/16/2015  . Low HDL (under 40) 07/14/2010  . Routine general medical examination at a health care facility 07/10/2010  . GERD (gastroesophageal reflux disease) 03/27/2009   Past Medical History:  Diagnosis Date  . Chest pain, unspecified   . Family history of ischemic heart disease   . Hemorrhoids   . Other malaise and fatigue   . Pneumonia    History reviewed. No pertinent surgical history. Social History   Tobacco Use  . Smoking status: Never Smoker  . Smokeless tobacco: Never Used  Substance Use Topics  . Alcohol use: Yes    Alcohol/week: 0.0 standard drinks    Comment: rare  . Drug use: No   Family History  Problem Relation Age of Onset  . Hypertension Brother   .  Hypertension Brother   . Breast cancer Mother    Allergies  Allergen Reactions  . Iohexol      Code: RASH, Desc: pt developed rash post 100 ml omni 350 for cta coronary exam. steroid taper prescribed after exam was complete. bsw 03/21/2009., Onset Date: [id number]    Current Outpatient Medications on File Prior to Visit  Medication Sig Dispense Refill  . amLODipine (NORVASC) 5 MG tablet Take 1 tablet (5 mg total) by mouth daily. 30 tablet 11  . Cholecalciferol (VITAMIN D3 PO) Take 2,000 Units by mouth daily.    . Multiple Vitamin (MULTIVITAMIN) capsule Take 1 capsule by mouth 2 (two) times daily.     . Omega-3 Fatty Acids (FISH OIL) 1000 MG CAPS Take 1 capsule by mouth 2 (two) times daily.     Marland Kitchen omeprazole (PRILOSEC) 20 MG capsule Take 20 mg by mouth daily as needed.    . vitamin C (ASCORBIC ACID) 500 MG tablet Take 500 mg by mouth daily.      . [DISCONTINUED] omeprazole (PRILOSEC OTC) 20 MG tablet Take 1 tablet (20 mg total) by mouth every morning. 30 tablet 11   No current facility-administered medications on file prior to visit.   Review of Systems  Constitutional: Positive for malaise/fatigue. Negative for chills and fever.  HENT: Positive for congestion and sore throat. Negative for ear pain and sinus pain.  Eyes: Negative for blurred vision, discharge and redness.  Respiratory: Positive for cough, sputum production and wheezing. Negative for shortness of breath and stridor.   Cardiovascular: Negative for chest pain, palpitations and leg swelling.  Gastrointestinal: Negative for abdominal pain, diarrhea, nausea and vomiting.  Musculoskeletal: Negative for myalgias.  Skin: Negative for rash.  Neurological: Negative for dizziness and headaches.    Observations/Objective: Patient appears well, in no distress Weight is baseline  No facial swelling or asymmetry Mildly hoarse voice  No obvious tremor or mobility impairment Moving neck and UEs normally Able to hear the call well   Cough is barky   No wheezing heard or sob noted during interview  Talkative and mentally sharp with no cognitive changes No skin changes on face or neck , no rash or pallor Affect is normal    Assessment and Plan: Problem List Items Addressed This Visit      Respiratory   Viral URI with cough    With some mild reactive airways - after a week  Neg covid test  Recommend fluids and rest and symptom control  Sent in tessalon for cough  Can continue the mucinex   Sent in albuterol for wheezing (consider prednisone if this worsens)  flonase for congestion and ETD symptoms  Urged to call if he develops ear or sinus pain  Update if not starting to improve in a week or if worsening    Recommend covid vaccine when he feels better as well           Follow Up Instructions: Drink fluids and rest  The mucinex medication is fine  Try albuterol for tight chest and wheezing (if worse we may need to send in some prednisone)  Try tessalon for additional cough control  flonase nasal spray to help inner ear congestion   Consider covid vaccination when you feel better    Update if not starting to improve in a week or if    I discussed the assessment and treatment plan with the patient. The patient was provided an opportunity to ask questions and all were answered. The patient agreed with the plan and demonstrated an understanding of the instructions.   The patient was advised to call back or seek an in-person evaluation if the symptoms worsen or if the condition fails to improve as anticipated.     Roxy Manns, MD

## 2020-01-08 NOTE — Patient Instructions (Signed)
Drink fluids and rest  The mucinex medication is fine  Try albuterol for tight chest and wheezing (if worse we may need to send in some prednisone)  Try tessalon for additional cough control  flonase nasal spray to help inner ear congestion   Consider covid vaccination when you feel better    Update if not starting to improve in a week or if worsening

## 2020-01-08 NOTE — Assessment & Plan Note (Signed)
With some mild reactive airways - after a week  Neg covid test  Recommend fluids and rest and symptom control  Sent in tessalon for cough  Can continue the mucinex   Sent in albuterol for wheezing (consider prednisone if this worsens)  flonase for congestion and ETD symptoms  Urged to call if he develops ear or sinus pain  Update if not starting to improve in a week or if worsening    Recommend covid vaccine when he feels better as well

## 2020-01-08 NOTE — Telephone Encounter (Signed)
FYI to PCP, pt has a virtual appt today with PCP

## 2020-03-20 ENCOUNTER — Encounter: Payer: Self-pay | Admitting: Family Medicine

## 2020-03-27 ENCOUNTER — Telehealth (INDEPENDENT_AMBULATORY_CARE_PROVIDER_SITE_OTHER): Payer: BC Managed Care – PPO | Admitting: Family Medicine

## 2020-03-27 ENCOUNTER — Encounter: Payer: Self-pay | Admitting: Family Medicine

## 2020-03-27 ENCOUNTER — Other Ambulatory Visit: Payer: Self-pay

## 2020-03-27 DIAGNOSIS — U071 COVID-19: Secondary | ICD-10-CM | POA: Diagnosis not present

## 2020-03-27 NOTE — Telephone Encounter (Signed)
appt scheduled at 12:30 pm today per Dr. Milinda Antis

## 2020-03-27 NOTE — Patient Instructions (Signed)
Drink fluids and rest  Delsym is good for cough (or mucinex dm for productive cough) Keep watching temperature If short of breath or severe symptoms go to the ER   I recommend vaccination for covid 19 as soon as you feel better

## 2020-03-27 NOTE — Progress Notes (Signed)
Virtual Visit via Video Note  I connected with Darryl Jenkins on 03/27/20 at 12:30 PM EST by a video enabled telemedicine application and verified that I am speaking with the correct person using two identifiers.  Location: Patient: home Provider: office   I discussed the limitations of evaluation and management by telemedicine and the availability of in person appointments. The patient expressed understanding and agreed to proceed.  This visit occurred during the SARS-CoV-2 public health emergency.  Safety protocols were in place, including screening questions prior to the visit, additional usage of staff PPE, and extensive cleaning of exam room while observing appropriate contact time as indicated for disinfecting solutions.   Parties involved in encounter  Patient: Darryl Jenkins  Provider:  Roxy Manns MD   History of Present Illness: Pt was diagnosed with covid on Frb 9th   Symptoms started 03/16/20 -ran a fever with aches and chills  Improved after 24 h  Upset stomach the next day  Then tired  Since then much improved Cough and nasal congestion the past few days (not blowing much out) ST- rough feeling throat but no pain   Laughing makes him cough  No sob and no wheezing   Drinking lots of fluids   Taste is coming back just a bit  Day 11   At home re test yesterday-still positive  Feels ok  Working remotely for now      Patient Active Problem List   Diagnosis Date Noted  . COVID-19 03/27/2020  . Colon cancer screening 05/24/2017  . Essential hypertension 05/24/2017  . Prediabetes 09/16/2015  . Viral URI with cough 02/19/2014  . Low HDL (under 40) 07/14/2010  . Routine general medical examination at a health care facility 07/10/2010  . GERD (gastroesophageal reflux disease) 03/27/2009   Past Medical History:  Diagnosis Date  . Chest pain, unspecified   . Family history of ischemic heart disease   . Hemorrhoids   . Other malaise and fatigue   . Pneumonia     History reviewed. No pertinent surgical history. Social History   Tobacco Use  . Smoking status: Never Smoker  . Smokeless tobacco: Never Used  Substance Use Topics  . Alcohol use: Yes    Alcohol/week: 0.0 standard drinks    Comment: rare  . Drug use: No   Family History  Problem Relation Age of Onset  . Hypertension Brother   . Hypertension Brother   . Breast cancer Mother    Allergies  Allergen Reactions  . Iohexol      Code: RASH, Desc: pt developed rash post 100 ml omni 350 for cta coronary exam. steroid taper prescribed after exam was complete. bsw 03/21/2009., Onset Date: [id number]    Current Outpatient Medications on File Prior to Visit  Medication Sig Dispense Refill  . albuterol (VENTOLIN HFA) 108 (90 Base) MCG/ACT inhaler Inhale 2 puffs into the lungs every 4 (four) hours as needed for wheezing. 1 each 0  . amLODipine (NORVASC) 5 MG tablet Take 1 tablet (5 mg total) by mouth daily. 30 tablet 11  . benzonatate (TESSALON) 200 MG capsule Take 1 capsule (200 mg total) by mouth 3 (three) times daily as needed. Swallow whole, do not bite pill 30 capsule 1  . Cholecalciferol (VITAMIN D3 PO) Take 2,000 Units by mouth daily.    . fluticasone (FLONASE) 50 MCG/ACT nasal spray Place 2 sprays into both nostrils daily. 16 g 6  . Multiple Vitamin (MULTIVITAMIN) capsule Take 1 capsule by mouth 2 (  two) times daily.    . Omega-3 Fatty Acids (FISH OIL) 1000 MG CAPS Take 1 capsule by mouth 2 (two) times daily.    Marland Kitchen omeprazole (PRILOSEC) 20 MG capsule Take 20 mg by mouth daily as needed.    . vitamin C (ASCORBIC ACID) 500 MG tablet Take 500 mg by mouth daily.    . [DISCONTINUED] omeprazole (PRILOSEC OTC) 20 MG tablet Take 1 tablet (20 mg total) by mouth every morning. 30 tablet 11   No current facility-administered medications on file prior to visit.   Review of Systems  Constitutional: Negative for chills, fever and malaise/fatigue.  HENT: Positive for congestion and sore throat.  Negative for ear pain and sinus pain.   Eyes: Negative for blurred vision, discharge and redness.  Respiratory: Positive for cough. Negative for sputum production, shortness of breath, wheezing and stridor.   Cardiovascular: Negative for chest pain, palpitations and leg swelling.  Gastrointestinal: Negative for abdominal pain, diarrhea, nausea and vomiting.  Musculoskeletal: Negative for myalgias.  Skin: Negative for rash.  Neurological: Negative for dizziness and headaches.    Observations/Objective: Patient appears well, in no distress Weight is baseline  No facial swelling or asymmetry Normal voice-not hoarse and no slurred speech (sounds a little congested) No obvious tremor or mobility impairment Moving neck and UEs normally Able to hear the call well  No cough or shortness of breath during interview  Talkative and mentally sharp with no cognitive changes No skin changes on face or neck , no rash or pallor Affect is normal    Assessment and Plan: Problem List Items Addressed This Visit      Other   COVID-19    Day 11 in a fairly mild course  Work place has a rule about negative test -otherwise feels well enough to go back  Not vaccinated-unsure if he will (has to be tested twice weekly at work) Some residual congestion and mild cough  Recommend rest/fluids Delsym for cough  Antihistamine if needed  Update if not starting to improve in a week or if worsening            Follow Up Instructions: Drink fluids and rest  Delsym is good for cough (or mucinex dm for productive cough) Keep watching temperature If short of breath or severe symptoms go to the ER   I recommend vaccination for covid 19 as soon as you feel better    I discussed the assessment and treatment plan with the patient. The patient was provided an opportunity to ask questions and all were answered. The patient agreed with the plan and demonstrated an understanding of the instructions.   The patient  was advised to call back or seek an in-person evaluation if the symptoms worsen or if the condition fails to improve as anticipated.     Roxy Manns, MD

## 2020-03-27 NOTE — Assessment & Plan Note (Signed)
Day 11 in a fairly mild course  Work place has a rule about negative test -otherwise feels well enough to go back  Not vaccinated-unsure if he will (has to be tested twice weekly at work) Some residual congestion and mild cough  Recommend rest/fluids Delsym for cough  Antihistamine if needed  Update if not starting to improve in a week or if worsening

## 2020-10-06 ENCOUNTER — Telehealth: Payer: Self-pay | Admitting: Family Medicine

## 2020-10-06 DIAGNOSIS — R7303 Prediabetes: Secondary | ICD-10-CM

## 2020-10-06 DIAGNOSIS — I10 Essential (primary) hypertension: Secondary | ICD-10-CM

## 2020-10-06 DIAGNOSIS — E786 Lipoprotein deficiency: Secondary | ICD-10-CM

## 2020-10-06 NOTE — Telephone Encounter (Signed)
-----   Message from Alvina Chou sent at 09/23/2020 11:22 AM EDT ----- Regarding: Lab orders for Monday, 8.29.22 Patient is scheduled for CPX labs, please order future labs, Thanks , Camelia Eng

## 2020-10-07 ENCOUNTER — Other Ambulatory Visit (INDEPENDENT_AMBULATORY_CARE_PROVIDER_SITE_OTHER): Payer: BC Managed Care – PPO

## 2020-10-07 ENCOUNTER — Other Ambulatory Visit: Payer: Self-pay

## 2020-10-07 DIAGNOSIS — E786 Lipoprotein deficiency: Secondary | ICD-10-CM

## 2020-10-07 DIAGNOSIS — I10 Essential (primary) hypertension: Secondary | ICD-10-CM | POA: Diagnosis not present

## 2020-10-07 DIAGNOSIS — R7303 Prediabetes: Secondary | ICD-10-CM | POA: Diagnosis not present

## 2020-10-07 LAB — COMPREHENSIVE METABOLIC PANEL
ALT: 18 U/L (ref 0–53)
AST: 19 U/L (ref 0–37)
Albumin: 4.7 g/dL (ref 3.5–5.2)
Alkaline Phosphatase: 33 U/L — ABNORMAL LOW (ref 39–117)
BUN: 14 mg/dL (ref 6–23)
CO2: 26 mEq/L (ref 19–32)
Calcium: 9.7 mg/dL (ref 8.4–10.5)
Chloride: 103 mEq/L (ref 96–112)
Creatinine, Ser: 0.91 mg/dL (ref 0.40–1.50)
GFR: 101.28 mL/min (ref >60.00)
Glucose, Bld: 105 mg/dL — ABNORMAL HIGH (ref 70–99)
Potassium: 4.8 mEq/L (ref 3.5–5.1)
Sodium: 138 mEq/L (ref 135–145)
Total Bilirubin: 1.3 mg/dL — ABNORMAL HIGH (ref 0.2–1.2)
Total Protein: 7.1 g/dL (ref 6.0–8.3)

## 2020-10-07 LAB — LIPID PANEL
Cholesterol: 162 mg/dL (ref 0–200)
HDL: 36.8 mg/dL — ABNORMAL LOW (ref >39.00)
LDL Cholesterol: 106 mg/dL — ABNORMAL HIGH (ref 0–99)
NonHDL: 125.4
Total CHOL/HDL Ratio: 4
Triglycerides: 97 mg/dL (ref 0.0–149.0)
VLDL: 19.4 mg/dL (ref 0.0–40.0)

## 2020-10-07 LAB — CBC WITH DIFFERENTIAL/PLATELET
Basophils Absolute: 0 10*3/uL (ref 0.0–0.1)
Basophils Relative: 0.8 % (ref 0.0–3.0)
Eosinophils Absolute: 0.1 10*3/uL (ref 0.0–0.7)
Eosinophils Relative: 1.6 % (ref 0.0–5.0)
HCT: 43.2 % (ref 39.0–52.0)
Hemoglobin: 14.8 g/dL (ref 13.0–17.0)
Lymphocytes Relative: 33.9 % (ref 12.0–46.0)
Lymphs Abs: 1.6 10*3/uL (ref 0.7–4.0)
MCHC: 34.2 g/dL (ref 30.0–36.0)
MCV: 88.6 fl (ref 78.0–100.0)
Monocytes Absolute: 0.3 10*3/uL (ref 0.1–1.0)
Monocytes Relative: 6.9 % (ref 3.0–12.0)
Neutro Abs: 2.7 10*3/uL (ref 1.4–7.7)
Neutrophils Relative %: 56.8 % (ref 43.0–77.0)
Platelets: 304 10*3/uL (ref 150.0–400.0)
RBC: 4.88 Mil/uL (ref 4.22–5.81)
RDW: 12.9 % (ref 11.5–15.5)
WBC: 4.8 10*3/uL (ref 4.0–10.5)

## 2020-10-07 LAB — TSH: TSH: 3.72 u[IU]/mL (ref 0.35–5.50)

## 2020-10-07 LAB — HEMOGLOBIN A1C: Hgb A1c MFr Bld: 5.9 % (ref 4.6–6.5)

## 2020-10-09 ENCOUNTER — Other Ambulatory Visit: Payer: Self-pay

## 2020-10-09 ENCOUNTER — Encounter: Payer: Self-pay | Admitting: Family Medicine

## 2020-10-09 ENCOUNTER — Ambulatory Visit (INDEPENDENT_AMBULATORY_CARE_PROVIDER_SITE_OTHER): Payer: BC Managed Care – PPO | Admitting: Family Medicine

## 2020-10-09 VITALS — BP 130/85 | HR 76 | Temp 98.4°F | Ht 68.25 in | Wt 201.6 lb

## 2020-10-09 DIAGNOSIS — Z1211 Encounter for screening for malignant neoplasm of colon: Secondary | ICD-10-CM | POA: Diagnosis not present

## 2020-10-09 DIAGNOSIS — R7303 Prediabetes: Secondary | ICD-10-CM | POA: Diagnosis not present

## 2020-10-09 DIAGNOSIS — Z0000 Encounter for general adult medical examination without abnormal findings: Secondary | ICD-10-CM | POA: Diagnosis not present

## 2020-10-09 DIAGNOSIS — E786 Lipoprotein deficiency: Secondary | ICD-10-CM

## 2020-10-09 DIAGNOSIS — I10 Essential (primary) hypertension: Secondary | ICD-10-CM

## 2020-10-09 MED ORDER — AMLODIPINE BESYLATE 5 MG PO TABS: 5.0000 mg | ORAL_TABLET | Freq: Every day | ORAL | 11 refills | Status: DC

## 2020-10-09 NOTE — Assessment & Plan Note (Signed)
Reviewed health habits including diet and exercise and skin cancer prevention Reviewed appropriate screening tests for age  Also reviewed health mt list, fam hx and immunization status , as well as social and family history   See HPI Labs reviewed  Commended on good health habits  Disc colon cancer screening-pt will check with ins re: what is covered (prefers colonoscopy if covered)  Will get flu shot at work later in the fall  Declines covid vaccine/has had covid No prostate concerns, no fam hx of prostate cancer

## 2020-10-09 NOTE — Assessment & Plan Note (Signed)
Lab Results  Component Value Date   HGBA1C 5.9 10/07/2020   Stable Good habits disc imp of low glycemic diet and wt loss to prevent DM2  Brother has h/o DM2

## 2020-10-09 NOTE — Progress Notes (Signed)
Subjective:    Patient ID: Darryl Jenkins, male    DOB: October 25, 1975, 45 y.o.   MRN: [id number]  This visit occurred during the SARS-CoV-2 public health emergency.  Safety protocols were in place, including screening questions prior to the visit, additional usage of staff PPE, and extensive cleaning of exam room while observing appropriate contact time as indicated for disinfecting solutions.   HPI Here for health maintenance exam and to review chronic medical problems    Wt Readings from Last 3 Encounters:  10/09/20 201 lb 9 oz (91.4 kg)  10/09/19 204 lb 3 oz (92.6 kg)  10/07/18 205 lb (93 kg)   30.42 kg/m  Feeling good  Got one child off to college at [employer]   Taking care of himself  Has learned to deal with stress at work better  Gym at least 5 d per week- 5:30 in the am   Flu shot -planned for later in fall (at school)   Covid status : had covid in February Recovered well (only symptom was fever and mild cough) No after effects  Taste/smell came back   Td 8/21  Prostate health  Thinks stream is just a little slower at night  Nocturia 0-1 time at night - baseline for him  No family h/o prostate cancer   Colon cancer screening  Had a colonoscopy in 2007 Did not check about coverage  Plans to , interested in colonoscopy    HTN BP Readings from Last 3 Encounters:  10/09/20 130/90  03/27/20 122/87  10/09/19 128/80   Pulse Readings from Last 3 Encounters:  10/09/20 76  03/27/20 69  10/09/19 78   Amlodipine 5 mg daily  No issues with bp or medication  Exercising    Has HTN in family, sibs  Mother had breast cancer Father died at 76  Lab Results  Component Value Date   CREATININE 0.91 10/07/2020   BUN 14 10/07/2020   NA 138 10/07/2020   K 4.8 10/07/2020   CL 103 10/07/2020   CO2 26 10/07/2020   Lab Results  Component Value Date   ALT 18 10/07/2020   AST 19 10/07/2020   ALKPHOS 33 (L) 10/07/2020   BILITOT 1.3 (H) 10/07/2020      Prediabetes Lab Results  Component Value Date   HGBA1C 5.9 10/07/2020  Stable Eating quite well  Still has a sweet tooth- eats for a celebration/etc  Keeps it out of the house  Brother has diabetes /obese  H/o low HDL Lab Results  Component Value Date   CHOL 162 10/07/2020   CHOL 155 10/06/2019   CHOL 165 10/03/2018   Lab Results  Component Value Date   HDL 36.80 (L) 10/07/2020   HDL 33.80 (L) 10/06/2019   HDL 31.50 (L) 10/03/2018   Lab Results  Component Value Date   LDLCALC 106 (H) 10/07/2020   LDLCALC 105 (H) 10/06/2019   LDLCALC 108 (H) 10/03/2018   Lab Results  Component Value Date   TRIG 97.0 10/07/2020   TRIG 80.0 10/06/2019   TRIG 128.0 10/03/2018   Lab Results  Component Value Date   CHOLHDL 4 10/07/2020   CHOLHDL 5 10/06/2019   CHOLHDL 5 10/03/2018   No results found for: LDLDIRECT Great exercise  Stable   Lab Results  Component Value Date   TSH 3.72 10/07/2020    Lab Results  Component Value Date   WBC 4.8 10/07/2020   HGB 14.8 10/07/2020   HCT 43.2 10/07/2020  MCV 88.6 10/07/2020   PLT 304.0 10/07/2020    Patient Active Problem List   Diagnosis Date Noted   Colon cancer screening 05/24/2017   Essential hypertension 05/24/2017   Prediabetes 09/16/2015   Low HDL (under 40) 07/14/2010   Routine general medical examination at a health care facility 07/10/2010   GERD (gastroesophageal reflux disease) 03/27/2009   Past Medical History:  Diagnosis Date   Chest pain, unspecified    Family history of ischemic heart disease    Hemorrhoids    Other malaise and fatigue    Pneumonia    History reviewed. No pertinent surgical history. Social History   Tobacco Use   Smoking status: Never   Smokeless tobacco: Never  Substance Use Topics   Alcohol use: Yes    Alcohol/week: 0.0 standard drinks    Comment: rare   Drug use: No   Family History  Problem Relation Age of Onset   Hypertension Brother    Hypertension Brother    Breast  cancer Mother    Allergies  Allergen Reactions   Iohexol      Code: RASH, Desc: pt developed rash post 100 ml omni 350 for cta coronary exam. steroid taper prescribed after exam was complete. bsw 03/21/2009., Onset Date: [id number]    Current Outpatient Medications on File Prior to Visit  Medication Sig Dispense Refill   Cholecalciferol (VITAMIN D3 PO) Take 2,000 Units by mouth daily.     fluticasone (FLONASE) 50 MCG/ACT nasal spray Place 2 sprays into both nostrils daily. 16 g 6   Multiple Vitamin (MULTIVITAMIN) capsule Take 1 capsule by mouth 2 (two) times daily.     Omega-3 Fatty Acids (FISH OIL) 1000 MG CAPS Take 1 capsule by mouth 2 (two) times daily.     omeprazole (PRILOSEC) 20 MG capsule Take 20 mg by mouth daily as needed.     vitamin C (ASCORBIC ACID) 500 MG tablet Take 500 mg by mouth daily.     No current facility-administered medications on file prior to visit.    Review of Systems  Constitutional:  Negative for activity change, appetite change, fatigue, fever and unexpected weight change.  HENT:  Negative for congestion, rhinorrhea, sore throat and trouble swallowing.   Eyes:  Negative for pain, redness, itching and visual disturbance.  Respiratory:  Negative for cough, chest tightness, shortness of breath and wheezing.   Cardiovascular:  Negative for chest pain and palpitations.  Gastrointestinal:  Negative for abdominal pain, blood in stool, constipation, diarrhea and nausea.  Endocrine: Negative for cold intolerance, heat intolerance, polydipsia and polyuria.  Genitourinary:  Negative for difficulty urinating, dysuria, frequency and urgency.  Musculoskeletal:  Negative for arthralgias, joint swelling and myalgias.  Skin:  Negative for pallor and rash.  Neurological:  Negative for dizziness, tremors, weakness, numbness and headaches.  Hematological:  Negative for adenopathy. Does not bruise/bleed easily.  Psychiatric/Behavioral:  Negative for decreased concentration and  dysphoric mood. The patient is not nervous/anxious.       Objective:   Physical Exam Constitutional:      General: He is not in acute distress.    Appearance: Normal appearance. He is well-developed and normal weight. He is not ill-appearing or diaphoretic.     Comments: Muscular build, bmi over 30 but not obese  HENT:     Head: Normocephalic and atraumatic.     Right Ear: Tympanic membrane, ear canal and external ear normal.     Left Ear: Tympanic membrane, ear canal and external ear normal.  Nose: Nose normal. No congestion.     Mouth/Throat:     Mouth: Mucous membranes are moist.     Pharynx: Oropharynx is clear. No posterior oropharyngeal erythema.  Eyes:     General: No scleral icterus.       Right eye: No discharge.        Left eye: No discharge.     Conjunctiva/sclera: Conjunctivae normal.     Pupils: Pupils are equal, round, and reactive to light.  Neck:     Thyroid: No thyromegaly.     Vascular: No carotid bruit or JVD.  Cardiovascular:     Rate and Rhythm: Normal rate and regular rhythm.     Pulses: Normal pulses.     Heart sounds: Normal heart sounds.    No gallop.  Pulmonary:     Effort: Pulmonary effort is normal. No respiratory distress.     Breath sounds: Normal breath sounds. No wheezing or rales.     Comments: Good air exch Chest:     Chest wall: No tenderness.  Abdominal:     General: Bowel sounds are normal. There is no distension or abdominal bruit.     Palpations: Abdomen is soft. There is no mass.     Tenderness: There is no abdominal tenderness.     Hernia: No hernia is present.  Musculoskeletal:        General: No tenderness.     Cervical back: Normal range of motion and neck supple. No rigidity. No muscular tenderness.     Right lower leg: No edema.     Left lower leg: No edema.  Lymphadenopathy:     Cervical: No cervical adenopathy.  Skin:    General: Skin is warm and dry.     Coloration: Skin is not pale.     Findings: No erythema or  rash.     Comments: Mildly tanned Solar lentigines diffusely  Scar from cyst removal low back  Neurological:     Mental Status: He is alert.     Cranial Nerves: No cranial nerve deficit.     Motor: No abnormal muscle tone.     Coordination: Coordination normal.     Gait: Gait normal.     Deep Tendon Reflexes: Reflexes are normal and symmetric. Reflexes normal.  Psychiatric:        Mood and Affect: Mood normal.        Cognition and Memory: Cognition and memory normal.          Assessment & Plan:   Problem List Items Addressed This Visit       Cardiovascular and Mediastinum   Essential hypertension    bp in fair control at this time  BP Readings from Last 1 Encounters:  10/09/20 130/85  No changes needed Most recent labs reviewed  Disc lifstyle change with low sodium diet and exercise  Great health habits  Plan to continue amlodipine 5 mg daily      Relevant Medications   amLODipine (NORVASC) 5 MG tablet     Other   Routine general medical examination at a health care facility - Primary    Reviewed health habits including diet and exercise and skin cancer prevention Reviewed appropriate screening tests for age  Also reviewed health mt list, fam hx and immunization status , as well as social and family history   See HPI Labs reviewed  Commended on good health habits  Disc colon cancer screening-pt will check with ins re: what is covered (prefers  colonoscopy if covered)  Will get flu shot at work later in the fall  Declines covid vaccine/has had covid No prostate concerns, no fam hx of prostate cancer       Low HDL (under 40)    Disc goals for lipids and reasons to control them Rev last labs with pt Rev low sat fat diet in detail  Up slightly this check  Great exercise  Stable LDL      Prediabetes    Lab Results  Component Value Date   HGBA1C 5.9 10/07/2020  Stable Good habits disc imp of low glycemic diet and wt loss to prevent DM2  Brother has h/o  DM2       Colon cancer screening    D/w patient [id number] for colon cancer screening, including IFOB or cologuard vs. colonoscopy.  Risks and benefits of both were discussed and patient voiced understanding.  Pt elects for: colonoscopy if insurance covers He will call us once he knows so we can choose

## 2020-10-09 NOTE — Assessment & Plan Note (Signed)
bp in fair control at this time  BP Readings from Last 1 Encounters:  10/09/20 130/85   No changes needed Most recent labs reviewed  Disc lifstyle change with low sodium diet and exercise  Great health habits  Plan to continue amlodipine 5 mg daily

## 2020-10-09 NOTE — Patient Instructions (Addendum)
For colon cancer screening - please find out what your insurance covers Colonoscopy is my first recommendation  Followed by cologuard If neither is covered- we do IFOB kit  Let us know what you want to do   To prevent diabetes  Try to get most of your carbohydrates from produce (with the exception of white potatoes)  Eat less bread/pasta/rice/snack foods/cereals/sweets and other items from the middle of the grocery store (processed carbs)  Keep taking good care of yourself  Wear sun protection

## 2020-10-09 NOTE — Assessment & Plan Note (Signed)
D/w patient [id number] for colon cancer screening, including IFOB or cologuard vs. colonoscopy.  Risks and benefits of both were discussed and patient voiced understanding.  Pt elects for: colonoscopy if insurance covers He will call us once he knows so we can choose

## 2020-10-09 NOTE — Assessment & Plan Note (Signed)
Disc goals for lipids and reasons to control them Rev last labs with pt Rev low sat fat diet in detail  Up slightly this check  Great exercise  Stable LDL

## 2020-10-16 ENCOUNTER — Other Ambulatory Visit: Payer: Self-pay | Admitting: Family Medicine

## 2020-11-25 ENCOUNTER — Encounter: Payer: Self-pay | Admitting: Family Medicine

## 2020-11-25 DIAGNOSIS — Z1211 Encounter for screening for malignant neoplasm of colon: Secondary | ICD-10-CM

## 2020-12-23 ENCOUNTER — Telehealth: Payer: Self-pay | Admitting: Gastroenterology

## 2020-12-23 ENCOUNTER — Other Ambulatory Visit: Payer: Self-pay

## 2020-12-23 DIAGNOSIS — Z1211 Encounter for screening for malignant neoplasm of colon: Secondary | ICD-10-CM

## 2020-12-23 MED ORDER — NA SULFATE-K SULFATE-MG SULF 17.5-3.13-1.6 GM/177ML PO SOLN: 1.0000 | Freq: Once | ORAL | 0 refills | Status: AC

## 2020-12-23 NOTE — Progress Notes (Signed)
Gastroenterology Pre-Procedure Review  Request Date: 02/04/21 Requesting Physician: Dr. Servando Snare  PATIENT REVIEW QUESTIONS: The patient responded to the following health history questions as indicated:    1. Are you having any GI issues? no 2. Do you have a personal history of Polyps?  unsure 3. Do you have a family history of Colon Cancer or Polyps? no 4. Diabetes Mellitus? no 5. Joint replacements in the past 12 months?no 6. Major health problems in the past 3 months?no 7. Any artificial heart valves, MVP, or defibrillator?no    MEDICATIONS & ALLERGIES:    Patient reports the following regarding taking any anticoagulation/antiplatelet therapy:   Plavix, Coumadin, Eliquis, Xarelto, Lovenox, Pradaxa, Brilinta, or Effient? no Aspirin? no  Patient confirms/reports the following medications:  Current Outpatient Medications  Medication Sig Dispense Refill   amLODipine (NORVASC) 5 MG tablet Take 1 tablet (5 mg total) by mouth daily. 30 tablet 11   Cholecalciferol (VITAMIN D3 PO) Take 2,000 Units by mouth daily.     fluticasone (FLONASE) 50 MCG/ACT nasal spray SPRAY 2 SPRAYS INTO EACH NOSTRIL EVERY DAY 48 mL 2   Multiple Vitamin (MULTIVITAMIN) capsule Take 1 capsule by mouth 2 (two) times daily.     Omega-3 Fatty Acids (FISH OIL) 1000 MG CAPS Take 1 capsule by mouth 2 (two) times daily.     omeprazole (PRILOSEC) 20 MG capsule Take 20 mg by mouth daily as needed.     vitamin C (ASCORBIC ACID) 500 MG tablet Take 500 mg by mouth daily.     No current facility-administered medications for this visit.    Patient confirms/reports the following allergies:  Allergies  Allergen Reactions   Iohexol      Code: RASH, Desc: pt developed rash post 100 ml omni 350 for cta coronary exam. steroid taper prescribed after exam was complete. bsw 03/21/2009., Onset Date: [id number]     No orders of the defined types were placed in this encounter.   AUTHORIZATION INFORMATION Primary Insurance: 1D#: Group  #:  Secondary Insurance: 1D#: Group #:  SCHEDULE INFORMATION: Date: 02/04/21 Time: Location: [facility]

## 2020-12-23 NOTE — Telephone Encounter (Signed)
Inbound call from pt wanting to schedule his colonoscopy. Pt is requesting Dr. Tobi Bastos.

## 2020-12-23 NOTE — Telephone Encounter (Signed)
Procedure scheduled for 02/04/21.

## 2021-01-23 ENCOUNTER — Other Ambulatory Visit: Payer: Self-pay

## 2021-01-23 ENCOUNTER — Telehealth: Payer: BC Managed Care – PPO | Admitting: Family Medicine

## 2021-01-23 DIAGNOSIS — R0981 Nasal congestion: Secondary | ICD-10-CM

## 2021-01-23 DIAGNOSIS — R519 Headache, unspecified: Secondary | ICD-10-CM

## 2021-01-23 MED ORDER — DOXYCYCLINE HYCLATE 100 MG PO TABS: 100.0000 mg | ORAL_TABLET | Freq: Two times a day (BID) | ORAL | 0 refills | Status: DC

## 2021-01-23 MED ORDER — BENZONATATE 100 MG PO CAPS: ORAL_CAPSULE | ORAL | 0 refills | Status: DC

## 2021-01-23 NOTE — Progress Notes (Signed)
Virtual Visit via Video Note  I connected with Darryl Jenkins  on 01/23/21 at 12:40 PM EST by a video enabled telemedicine application and verified that I am speaking with the correct person using two identifiers.  Location patient: home, [place] Location provider:work or home office Persons participating in the virtual visit: patient, provider  I discussed the limitations of evaluation and management by telemedicine and the availability of in person appointments. The patient expressed understanding and agreed to proceed.   HPI:  Acute telemedicine visit for Sinus issues: -Onset: about 3 weeks ago -Symptoms include: now worsening with discolored nasal sinus congestion, pressure and pain in the sinuses, L ear discomfort, PND, cough -Denies: fevers, CP, SOB, NVD -several negative covid test -Pertinent past medical history: see below -Pertinent medication allergies:  Allergies  Allergen Reactions   Iohexol      Code: RASH, Desc: pt developed rash post 100 ml omni 350 for cta coronary exam. steroid taper prescribed after exam was complete. bsw 03/21/2009., Onset Date: [id number]   -COVID-19 vaccine status:  Immunization History  Administered Date(s) Administered   Influenza Whole 12/10/2001   Influenza,inj,Quad PF,6+ Mos 12/06/2017, 10/07/2018   Influenza-Unspecified 12/05/2012, 12/09/2014   PPD Test 09/07/2017   Pneumococcal Polysaccharide-23 02/09/2002, 04/17/2009   Td 02/09/1998, 04/17/2009, 10/09/2019     ROS: See pertinent positives and negatives per HPI.  Past Medical History:  Diagnosis Date   Chest pain, unspecified    Family history of ischemic heart disease    Hemorrhoids    Other malaise and fatigue    Pneumonia     No past surgical history on file.   Current Outpatient Medications:    benzonatate (TESSALON PERLES) 100 MG capsule, 1-2 capsules up to twice daily as needed for cough, Disp: 30 capsule, Rfl: 0   doxycycline (VIBRA-TABS) 100 MG tablet, Take 1 tablet (100 mg  total) by mouth 2 (two) times daily., Disp: 20 tablet, Rfl: 0   amLODipine (NORVASC) 5 MG tablet, Take 1 tablet (5 mg total) by mouth daily., Disp: 30 tablet, Rfl: 11   Cholecalciferol (VITAMIN D3 PO), Take 2,000 Units by mouth daily., Disp: , Rfl:    fluticasone (FLONASE) 50 MCG/ACT nasal spray, SPRAY 2 SPRAYS INTO EACH NOSTRIL EVERY DAY, Disp: 48 mL, Rfl: 2   Multiple Vitamin (MULTIVITAMIN) capsule, Take 1 capsule by mouth 2 (two) times daily., Disp: , Rfl:    Omega-3 Fatty Acids (FISH OIL) 1000 MG CAPS, Take 1 capsule by mouth 2 (two) times daily., Disp: , Rfl:    omeprazole (PRILOSEC) 20 MG capsule, Take 20 mg by mouth daily as needed., Disp: , Rfl:    vitamin C (ASCORBIC ACID) 500 MG tablet, Take 500 mg by mouth daily., Disp: , Rfl:   EXAM:  VITALS per patient if applicable:  GENERAL: alert, oriented, appears well and in no acute distress  HEENT: atraumatic, conjunttiva clear, no obvious abnormalities on inspection of external nose and ears  NECK: normal movements of the head and neck  LUNGS: on inspection no signs of respiratory distress, breathing rate appears normal, no obvious gross SOB, gasping or wheezing  CV: no obvious cyanosis  MS: moves all visible extremities without noticeable abnormality  PSYCH/NEURO: pleasant and cooperative, no obvious depression or anxiety, speech and thought processing grossly intact  ASSESSMENT AND PLAN:  Discussed the following assessment and plan:  Nasal congestion  Facial discomfort  -we discussed possible serious and likely etiologies, options for evaluation and workup, limitations of telemedicine visit vs in person visit,  treatment, treatment risks and precautions. Pt is agreeable to treatment via telemedicine at this moment.  Query developing bacterial sinusitis, new acute viral infection vs other. Discussed risks with abx and indications. He wants to try empiric treatment with doxy and Tessalon for cough. Advise to seek prompt VV or  in person care if worsening, new symptoms arise, or if is not improving with treatment.  I discussed the assessment and treatment plan with the patient. The patient was provided an opportunity to ask questions and all were answered. The patient agreed with the plan and demonstrated an understanding of the instructions.     Terressa Koyanagi, DO

## 2021-01-23 NOTE — Patient Instructions (Signed)
-  I sent the medication(s) we discussed to your pharmacy: °Meds ordered this encounter  °Medications  ° doxycycline (VIBRA-TABS) 100 MG tablet  °  Sig: Take 1 tablet (100 mg total) by mouth 2 (two) times daily.  °  Dispense:  20 tablet  °  Refill:  0  ° benzonatate (TESSALON PERLES) 100 MG capsule  °  Sig: 1-2 capsules up to twice daily as needed for cough  °  Dispense:  30 capsule  °  Refill:  0  ° ° ° °I hope you are feeling better soon! ° °Seek in person care promptly if your symptoms worsen, new concerns arise or you are not improving with treatment. ° °It was nice to meet you today. I help [facility] out with telemedicine visits on Tuesdays and Thursdays and am happy to help if you need a virtual follow up visit on those days. Otherwise, if you have any concerns or questions following this visit please schedule a follow up visit with your Primary Care office or seek care at a local urgent care clinic to avoid delays in care ° °

## 2021-01-31 ENCOUNTER — Encounter: Payer: Self-pay | Admitting: Gastroenterology

## 2021-02-04 ENCOUNTER — Encounter
Admission: RE | Disposition: A | Discharge status: Home / Self Care | Payer: Self-pay | Source: Ambulatory Visit | Attending: Gastroenterology

## 2021-02-04 ENCOUNTER — Ambulatory Visit: Payer: BC Managed Care – PPO | Admitting: Anesthesiology

## 2021-02-04 ENCOUNTER — Ambulatory Visit
Admission: RE | Admit: 2021-02-04 | Discharge: 2021-02-04 | Disposition: A | Discharge status: Home / Self Care | Payer: BC Managed Care – PPO | Source: Ambulatory Visit | Attending: Gastroenterology | Admitting: Gastroenterology

## 2021-02-04 DIAGNOSIS — Z79899 Other long term (current) drug therapy: Secondary | ICD-10-CM | POA: Insufficient documentation

## 2021-02-04 DIAGNOSIS — K219 Gastro-esophageal reflux disease without esophagitis: Secondary | ICD-10-CM | POA: Diagnosis not present

## 2021-02-04 DIAGNOSIS — Z1211 Encounter for screening for malignant neoplasm of colon: Secondary | ICD-10-CM | POA: Diagnosis present

## 2021-02-04 DIAGNOSIS — I10 Essential (primary) hypertension: Secondary | ICD-10-CM | POA: Insufficient documentation

## 2021-02-04 HISTORY — PX: COLONOSCOPY WITH PROPOFOL: SHX5780

## 2021-02-04 SURGERY — COLONOSCOPY WITH PROPOFOL
Anesthesia: General

## 2021-02-04 MED ORDER — SODIUM CHLORIDE 0.9 % IV SOLN
INTRAVENOUS | Status: DC
  Administered 2021-02-04: 09:00:00 20 mL/h via INTRAVENOUS

## 2021-02-04 MED ORDER — FENTANYL CITRATE (PF) 100 MCG/2ML IJ SOLN
INTRAMUSCULAR | Status: DC | PRN
  Administered 2021-02-04 (×2): 50 ug via INTRAVENOUS

## 2021-02-04 MED ORDER — MIDAZOLAM HCL 2 MG/2ML IJ SOLN
INTRAMUSCULAR | Status: DC | PRN
  Administered 2021-02-04: 2 mg via INTRAVENOUS

## 2021-02-04 MED ORDER — PROPOFOL 10 MG/ML IV BOLUS
INTRAVENOUS | Status: DC | PRN
  Administered 2021-02-04: 8 mg via INTRAVENOUS

## 2021-02-04 MED ORDER — PROPOFOL 500 MG/50ML IV EMUL
INTRAVENOUS | Status: DC | PRN
  Administered 2021-02-04: 100 ug/kg/min via INTRAVENOUS

## 2021-02-04 NOTE — Anesthesia Postprocedure Evaluation (Signed)
Anesthesia Post Note  Patient: Darryl Jenkins  Procedure(s) Performed: COLONOSCOPY WITH PROPOFOL  Patient location during evaluation: Phase II Anesthesia Type: General Level of consciousness: awake and alert, awake and oriented Pain management: pain level controlled Vital Signs Assessment: post-procedure vital signs reviewed and stable Respiratory status: spontaneous breathing, nonlabored ventilation and respiratory function stable Cardiovascular status: blood pressure returned to baseline and stable Postop Assessment: no apparent nausea or vomiting Anesthetic complications: no   No notable events documented.   Last Vitals:  Vitals:   02/04/21 0935 02/04/21 0938  BP: 112/79 112/79  Pulse: 70 72  Resp: 18 17  Temp: (!) 36.3 C   SpO2: 96% 96%    Last Pain:  Vitals:   02/04/21 0955  TempSrc:   PainSc: 0-No pain                 Manfred Arch

## 2021-02-04 NOTE — Transfer of Care (Signed)
Immediate Anesthesia Transfer of Care Note  Patient: Darryl Jenkins  Procedure(s) Performed: COLONOSCOPY WITH PROPOFOL  Patient Location: PACU  Anesthesia Type:General  Level of Consciousness: sedated  Airway & Oxygen Therapy: Patient Spontanous Breathing and Patient connected to nasal cannula oxygen  Post-op Assessment: Report given to RN and Post -op Vital signs reviewed and stable  Post vital signs: Reviewed and stable  Last Vitals:  Vitals Value Taken Time  BP 112/79 02/04/21 0938  Temp 36.3 C 02/04/21 0935  Pulse 72 02/04/21 0938  Resp 17 02/04/21 0938  SpO2 96 % 02/04/21 0938    Last Pain:  Vitals:   02/04/21 0935  TempSrc: Temporal  PainSc: Asleep         Complications: No notable events documented.

## 2021-02-04 NOTE — Op Note (Addendum)
[facility] Patient Name: Darryl Jenkins Procedure Date: 02/04/2021 9:05 AM MRN: [id number] Account #: [id number] Date of Birth: December 27, 1975 Admit Type: Outpatient Age: 45 Room: [facility] ENDO ROOM 4 Gender: Male Note Status: Finalized Instrument Name: Prentice Docker [id number] Procedure:             Colonoscopy Indications:           Screening for colorectal malignant neoplasm Providers:             Midge Minium MD, MD Referring MD:          Audrie Gallus. Tower (Referring MD) Medicines:             Propofol per Anesthesia Complications:         No immediate complications. Procedure:             Pre-Anesthesia Assessment:                        - Prior to the procedure, a History and Physical was                         performed, and patient medications and allergies were                         reviewed. The patient's tolerance of previous                         anesthesia was also reviewed. The risks and benefits                         of the procedure and the sedation options and risks                         were discussed with the patient. All questions were                         answered, and informed consent was obtained. Prior                         Anticoagulants: The patient has taken no previous                         anticoagulant or antiplatelet agents. ASA Grade                         Assessment: II - A patient with mild systemic disease.                         After reviewing the risks and benefits, the patient                         was deemed in satisfactory condition to undergo the                         procedure.                        After obtaining informed consent, the colonoscope was  passed under direct vision. Throughout the procedure,                         the patient's blood pressure, pulse, and oxygen                         saturations were monitored continuously. The                          Colonoscope was introduced through the anus and                         advanced to the the terminal ileum. The colonoscopy                         was performed without difficulty. The patient                         tolerated the procedure well. The quality of the bowel                         preparation was excellent. Findings:      The perianal and digital rectal examinations were normal.      The entire examined colon appeared normal. Impression:            - The entire examined colon is normal.                        - No specimens collected. Recommendation:        - Discharge patient to home.                        - Resume previous diet.                        - Continue present medications.                        - Repeat colonoscopy in 10 years for screening                         purposes. Procedure Code(s):     --- Professional ---                        [id number], Colonoscopy, flexible; diagnostic, including                         collection of specimen(s) by brushing or washing, when                         performed (separate procedure) Diagnosis Code(s):     --- Professional ---                        Z12.11, Encounter for screening for malignant neoplasm                         of colon CPT copyright 2019 American Medical Association. All rights reserved. The codes documented in this report are preliminary and upon coder review may  be  revised to meet current compliance requirements. Midge Minium MD, MD 02/04/2021 9:33:21 AM This report has been signed electronically. Number of Addenda: 0 Note Initiated On: 02/04/2021 9:05 AM Scope Withdrawal Time: 0 hours 9 minutes 43 seconds  Total Procedure Duration: 0 hours 14 minutes 40 seconds  Estimated Blood Loss:  Estimated blood loss: none.      [facility]

## 2021-02-04 NOTE — H&P (Signed)
Midge Minium, MD [facility] [address], [address] [place], [place] [address] Phone: [phone] Fax : [phone]  Primary Care Physician:  Tower, Audrie Gallus, MD Primary Gastroenterologist:  Dr. Servando Snare  Pre-Procedure History & Physical: HPI:  Darryl Jenkins is a 45 y.o. male is here for a screening colonoscopy.   Past Medical History:  Diagnosis Date   Chest pain, unspecified    Family history of ischemic heart disease    Hemorrhoids    Other malaise and fatigue    Pneumonia     History reviewed. No pertinent surgical history.  Prior to Admission medications   Medication Sig Start Date End Date Taking? Authorizing Provider  amLODipine (NORVASC) 5 MG tablet Take 1 tablet (5 mg total) by mouth daily. 10/09/20  Yes Tower, Audrie Gallus, MD  benzonatate (TESSALON PERLES) 100 MG capsule 1-2 capsules up to twice daily as needed for cough 01/23/21  Yes Kriste Basque R, DO  Cholecalciferol (VITAMIN D3 PO) Take 2,000 Units by mouth daily.   Yes [provider]  doxycycline (VIBRA-TABS) 100 MG tablet Take 1 tablet (100 mg total) by mouth 2 (two) times daily. 01/23/21  Yes Kriste Basque R, DO  fluticasone (FLONASE) 50 MCG/ACT nasal spray SPRAY 2 SPRAYS INTO EACH NOSTRIL EVERY DAY 10/18/20  Yes Tower, Audrie Gallus, MD  Multiple Vitamin (MULTIVITAMIN) capsule Take 1 capsule by mouth 2 (two) times daily.   Yes [provider]  Omega-3 Fatty Acids (FISH OIL) 1000 MG CAPS Take 1 capsule by mouth 2 (two) times daily.   Yes [provider]  omeprazole (PRILOSEC) 20 MG capsule Take 20 mg by mouth daily as needed.   Yes [provider]  vitamin C (ASCORBIC ACID) 500 MG tablet Take 500 mg by mouth daily.   Yes [provider]    Allergies as of 12/24/2020 - Review Complete 10/09/2020  Allergen Reaction Noted   Iohexol  03/21/2009    Family History  Problem Relation Age of Onset   Breast cancer Mother    Hypertension Brother    Diabetes type II Brother    Hypertension  Brother     Social History   Socioeconomic History   Marital status: Married    Spouse name: Not on file   Number of children: Not on file   Years of education: Not on file   Highest education level: Not on file  Occupational History   Not on file  Tobacco Use   Smoking status: Never   Smokeless tobacco: Never  Substance and Sexual Activity   Alcohol use: Yes    Alcohol/week: 0.0 standard drinks    Comment: rare   Drug use: No   Sexual activity: Not on file  Other Topics Concern   Not on file  Social History Narrative   Not on file   Social Determinants of Health   Financial Resource Strain: Not on file  Food Insecurity: Not on file  Transportation Needs: Not on file  Physical Activity: Not on file  Stress: Not on file  Social Connections: Not on file  Intimate Partner Violence: Not on file    Review of Systems: See HPI, otherwise negative ROS  Physical Exam: BP (!) 131/117    Pulse (!) 101    Temp (!) 97.3 F (36.3 C) (Temporal)    Resp 20    Ht 5' 8.5" (1.74 m)    Wt 91.2 kg    SpO2 98%    BMI 30.12 kg/m  General:  Alert,  pleasant and cooperative in NAD Head:  Normocephalic and atraumatic. Neck:  Supple; no masses or thyromegaly. Lungs:  Clear throughout to auscultation.    Heart:  Regular rate and rhythm. Abdomen:  Soft, nontender and nondistended. Normal bowel sounds, without guarding, and without rebound.   Neurologic:  Alert and  oriented x4;  grossly normal neurologically.  Impression/Plan: [name] is now here to undergo a screening colonoscopy.  Risks, benefits, and alternatives regarding colonoscopy have been reviewed with the patient.  Questions have been answered.  All parties agreeable.

## 2021-02-04 NOTE — Anesthesia Preprocedure Evaluation (Signed)
Anesthesia Evaluation  Patient identified by MRN, date of birth, ID band Patient awake    Reviewed: Allergy & Precautions, NPO status , Patient's Chart, lab work & pertinent test results  Airway Mallampati: II  TM Distance: >3 FB Neck ROM: Full    Dental no notable dental hx.    Pulmonary pneumonia, resolved,    Pulmonary exam normal        Cardiovascular hypertension, Pt. on medications Normal cardiovascular exam     Neuro/Psych negative neurological ROS  negative psych ROS   GI/Hepatic Neg liver ROS, Bowel prep,GERD  Medicated and Controlled,  Endo/Other  negative endocrine ROS  Renal/GU negative Renal ROS  negative genitourinary   Musculoskeletal negative musculoskeletal ROS (+)   Abdominal   Peds negative pediatric ROS (+)  Hematology negative hematology ROS (+)   Anesthesia Other Findings Chest pain, unspecified    Family history of ischemic heart disease   Hemorrhoids    Other malaise and fatigue    Pneumonia       Reproductive/Obstetrics negative OB ROS                            Anesthesia Physical Anesthesia Plan  ASA: 2  Anesthesia Plan: General   Post-op Pain Management:    Induction: Intravenous  PONV Risk Score and Plan: 2 and Propofol infusion and TIVA  Airway Management Planned: Natural Airway and Nasal Cannula  Additional Equipment:   Intra-op Plan:   Post-operative Plan:   Informed Consent: I have reviewed the patients History and Physical, chart, labs and discussed the procedure including the risks, benefits and alternatives for the proposed anesthesia with the patient or authorized representative who has indicated his/her understanding and acceptance.       Plan Discussed with: CRNA, Anesthesiologist and Surgeon  Anesthesia Plan Comments:         Anesthesia Quick Evaluation

## 2021-02-05 ENCOUNTER — Encounter: Payer: Self-pay | Admitting: Gastroenterology

## 2021-09-05 ENCOUNTER — Encounter: Payer: Self-pay | Admitting: Family Medicine

## 2021-09-10 ENCOUNTER — Telehealth: Payer: Self-pay

## 2021-09-10 ENCOUNTER — Encounter: Payer: Self-pay | Admitting: Family Medicine

## 2021-09-10 ENCOUNTER — Ambulatory Visit: Payer: BC Managed Care – PPO | Admitting: Family Medicine

## 2021-09-10 VITALS — BP 120/84 | HR 67 | Temp 97.9°F | Ht 68.25 in | Wt 194.5 lb

## 2021-09-10 DIAGNOSIS — S060X0A Concussion without loss of consciousness, initial encounter: Secondary | ICD-10-CM | POA: Diagnosis not present

## 2021-09-10 NOTE — Telephone Encounter (Signed)
Pt called back. He took off of work the next 3 days. I made him an appt with Dr Patsy Lager today.

## 2021-09-10 NOTE — Telephone Encounter (Signed)
I left a message for the patient to return my call.  Pt need to make an appt to see Dr tower.
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
## 2021-09-10 NOTE — Patient Instructions (Signed)
Mild Traumatic Brain Injury (CONCUSSSION):  Darryl Scheel, MD,  Sports Medicine Adapted from [facility], [facility], and [employer]  Think of the human brain almost as an egg yolk and your skull as an egg shell. When your head or body takes a hit, it can cause your brain to shake around inside your skull, injuring the brain. A concussion is not only caused by a hit to the head, but can also be caused by an impact to the body that ends up shaking your brain in your skull, such as whiplash.  A common misconception about concussion is that one loses consciousness. However, loss of consciousness occurs in only less than 10% of concussion cases.  Concussive symptoms typically resolve in 7 to 10 days (sports-related concussions) or within 3 months (non-athletes).  Approximately 20% of people have symptoms after 6 weeks.  With concussions, we have learned that it generally takes youth longer to recover from concussions. Another thing that we now know about concussions is that it usually takes male athletes longer to recover than male athletes.  No two concussions are identical. In fact, there are at least six different clinical trajectories that concussions may take.  Signs and Symptoms of Concussion:  The signs and symptoms of a concussion are incredibly important because a concussion doesn't show up on imaging like an x-ray, CT, or MRI scan and there is no objective test, like a blood or saliva test, that can determine if a patient has a concussion. A doctor makes a concussion diagnosis based on the results of a comprehensive examination, which includes observing signs of concussion and patients reporting symptoms of concussion appearing after an impact to the head or body. Concussion signs and symptoms are the brain's way of showing it is injured and not functioning normally.  CONCUSSION SIGNS  Concussion signs are what someone could observe about you to  determine if you have a concussion.   Common concussion signs include:  Loss of consciousness Problems with balance Glazed look in the eyes Amnesia Delayed response to questions Forgetting an instruction, confusion about an assignment or position, or  confusion of the game, score, or opponent Inappropriate crying Inappropriate laughter Vomiting  CONCUSSION SYMPTOMS  Concussion symptoms are what someone who is concussed will tell you that they are experiencing. Concussion symptoms typically fall into six major categories:  1- Somatic (Physical) Symptoms  Headache Light-headedness Dizziness Nausea Sensitivity to light Sensitivity to noise  2- Cognitive Symptoms  Difficulties with attention Memory problems Loss of focus Difficulty multitasking Difficulty completing mental tasks  3- Sleep Symptoms  Sleeping more than usual Sleeping less than usual Having trouble falling asleep  4- Emotional Symptoms  Anxiety Depression Panic attacks  5 - Vestibular Symptoms  The vestibular system is affected in nearly 60% of youth and adolescent athletes following a concussion.  But what is the vestibular system?  It's the sensory system that helps with your sense of balance and spatial orientation. Think of a gyroscope! With help from your inner ears, your vestibular system detects the motion or position of your head in space. It sends information to your brain that's needed for balance and stable vision.  Need an example? If you're moving and looking at moving objects at the same time (think riding in a car), you're able to stay focused and not lose visual clarity. During a vestibular concussion, your gyroscope isn't working at full potential.  Difficulty with balance Dizziness. It may feel like the room is spinning   or a slow, wavy sensation. (Like you're on a boat!) Trouble stabilizing vision when moving your head. (We call this Vestibular-Ocular Reflux, or VOR.) Think  back to riding in a car. With a vestibular concussion, you can't stay focused. (The technical name for this is Visual Motion Sensitivity, or VMS.) Triggers  These 3 things could bring on vestibular symptoms:  Dynamic movements Busy environments, like the grocery store Crowds  6 - Oculo-motor  A concussion that affects the ocular, or visual, system of the brain. Typically, patients with ocular-motor concussions report pressure headaches in the front of their head, feeling more tired than normal, and becoming more symptomatic doing math or science exercises at school.  Patients also experience difficulties with their eyes working together. Follow along to explore some of the most common symptoms.  Convergence  The eyes converge when viewing objects up close, such as with reading. With convergence problems, patients may see a double image as a target moves closer to them. Typically, without a concussion, objects can be brought very close without doubling.  Accommodation  Accommodation problems cause an object to become blurry as it is viewed up close. Accommodative and convergence problems are often experienced together and can impact reading and other near-vision activities.  Pursuits and Saccades  The eyes use pursuit eye movements to follow objects; while saccade eyes movements allow the eyes to shift rapidly from one object to another. Tracking objects, reading a book, scrolling on a computer or even watching for a moving car while crossing the street can be difficult when patients have problems with pursuit and saccade eye movements.  Misalignment  When people with eye misalignment (one eye drifts, eyes aren't perfectly aligned) sustain a concussion, the brain may have difficulty compensating for the misalignment like it once did. This can result in blurry vision, difficulty taking notes in class and focusing on the chalkboard.  Note: This is not an exhaustive list of concussion  signs and symptoms, and it may take a few days for concussion symptoms to appear after the initial injury.  TREATMENT:  THE MOST IMPORTANT THING IS REST AS SOON AS POSSIBLE AFTER INJURY SO THAT THE BRAIN CAN RECOVER. COMPLETE PHYSICAL AND MENTAL REST ARE NEEDED INITIALLY.   THAT MEANS: NO SCHOOL OR WORK FOR AT LEAST 3 DAYS AND CLEARED BY YOUR DOCTOR NO MENTAL EXERTION, MEANING NO WORK, NO HOMEWORK, NO TEST TAKING.  Avoid situations with loud noise, bright lights, or crowds. However, this doesn't mean isolate yourself in a dark room for a week. Too much isolation and boredom can be harmful, contributing to feelings of anxiety, depression, and resulting in increased recovery time. Spend time with friends and family, but monitor your symptoms and avoid situations that make you feel worse.   NO VIDEO GAMES, NO USING THE COMPUTER, NO TEXTING, NO USING SMARTPHONES, NO USE OF AN IPAD OR TABLET. DO NOT GO TO A MOVIE THEATRE OR WATCH SPORTS ON TV. HDTV TENDS TO MAKE PEOPLE FEEL WORSE.   ON APPROXIMATELY DAY 4, BEGIN VERY LIGHT EXERCISE SUCH AS WALKING. DO NOT START ANY STRENUOUS EXERCISE.   You will be given return to school or return to work recommendations.  

## 2021-09-24 ENCOUNTER — Encounter: Payer: Self-pay | Admitting: Family Medicine

## 2021-09-24 ENCOUNTER — Ambulatory Visit: Payer: BC Managed Care – PPO | Admitting: Family Medicine

## 2021-09-24 ENCOUNTER — Ambulatory Visit (INDEPENDENT_AMBULATORY_CARE_PROVIDER_SITE_OTHER): Payer: BC Managed Care – PPO | Admitting: Family Medicine

## 2021-09-24 VITALS — BP 116/70 | HR 80 | Temp 98.1°F | Ht 68.25 in | Wt 195.5 lb

## 2021-09-24 DIAGNOSIS — S060X0D Concussion without loss of consciousness, subsequent encounter: Secondary | ICD-10-CM
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
## 2021-10-06 ENCOUNTER — Telehealth: Payer: Self-pay | Admitting: Family Medicine

## 2021-10-06 DIAGNOSIS — R7303 Prediabetes: Secondary | ICD-10-CM

## 2021-10-06 DIAGNOSIS — I10 Essential (primary) hypertension: Secondary | ICD-10-CM

## 2021-10-06 NOTE — Telephone Encounter (Signed)
-----   Message from Terri J Walsh sent at 09/25/2021  1:18 PM EDT ----- Regarding: Lab orders for Tuesday, 8.29.23 Patient is scheduled for CPX labs, please order future labs, Thanks , Terri   

## 2021-10-07 ENCOUNTER — Other Ambulatory Visit (INDEPENDENT_AMBULATORY_CARE_PROVIDER_SITE_OTHER): Payer: BC Managed Care – PPO

## 2021-10-07 DIAGNOSIS — I10 Essential (primary) hypertension: Secondary | ICD-10-CM | POA: Diagnosis not present

## 2021-10-07 DIAGNOSIS — R7303 Prediabetes: Secondary | ICD-10-CM | POA: Diagnosis not present

## 2021-10-07 LAB — COMPREHENSIVE METABOLIC PANEL
ALT: 19 U/L (ref 0–53)
AST: 16 U/L (ref 0–37)
Albumin: 4.5 g/dL (ref 3.5–5.2)
Alkaline Phosphatase: 33 U/L — ABNORMAL LOW (ref 39–117)
BUN: 14 mg/dL (ref 6–23)
CO2: 27 mEq/L (ref 19–32)
Calcium: 9.5 mg/dL (ref 8.4–10.5)
Chloride: 104 mEq/L (ref 96–112)
Creatinine, Ser: 0.92 mg/dL (ref 0.40–1.50)
GFR: 99.26 mL/min (ref >60.00)
Glucose, Bld: 107 mg/dL — ABNORMAL HIGH (ref 70–99)
Potassium: 4.7 mEq/L (ref 3.5–5.1)
Sodium: 139 mEq/L (ref 135–145)
Total Bilirubin: 1.5 mg/dL — ABNORMAL HIGH (ref 0.2–1.2)
Total Protein: 6.9 g/dL (ref 6.0–8.3)

## 2021-10-07 LAB — CBC WITH DIFFERENTIAL/PLATELET
Basophils Absolute: 0 10*3/uL (ref 0.0–0.1)
Basophils Relative: 0.9 % (ref 0.0–3.0)
Eosinophils Absolute: 0.1 10*3/uL (ref 0.0–0.7)
Eosinophils Relative: 2.3 % (ref 0.0–5.0)
HCT: 42.2 % (ref 39.0–52.0)
Hemoglobin: 14.8 g/dL (ref 13.0–17.0)
Lymphocytes Relative: 35 % (ref 12.0–46.0)
Lymphs Abs: 1.6 10*3/uL (ref 0.7–4.0)
MCHC: 35 g/dL (ref 30.0–36.0)
MCV: 88.6 fl (ref 78.0–100.0)
Monocytes Absolute: 0.3 10*3/uL (ref 0.1–1.0)
Monocytes Relative: 7.3 % (ref 3.0–12.0)
Neutro Abs: 2.5 10*3/uL (ref 1.4–7.7)
Neutrophils Relative %: 54.5 % (ref 43.0–77.0)
Platelets: 284 10*3/uL (ref 150.0–400.0)
RBC: 4.76 Mil/uL (ref 4.22–5.81)
RDW: 12.6 % (ref 11.5–15.5)
WBC: 4.6 10*3/uL (ref 4.0–10.5)

## 2021-10-07 LAB — LIPID PANEL
Cholesterol: 159 mg/dL (ref 0–200)
HDL: 33.3 mg/dL — ABNORMAL LOW (ref >39.00)
LDL Cholesterol: 104 mg/dL — ABNORMAL HIGH (ref 0–99)
NonHDL: 125.94
Total CHOL/HDL Ratio: 5
Triglycerides: 112 mg/dL (ref 0.0–149.0)
VLDL: 22.4 mg/dL (ref 0.0–40.0)

## 2021-10-07 LAB — HEMOGLOBIN A1C: Hgb A1c MFr Bld: 6.1 % (ref 4.6–6.5)

## 2021-10-07 LAB — TSH: TSH: 2.72 u[IU]/mL (ref 0.35–5.50)

## 2021-10-14 ENCOUNTER — Ambulatory Visit (INDEPENDENT_AMBULATORY_CARE_PROVIDER_SITE_OTHER): Payer: BC Managed Care – PPO | Admitting: Family Medicine

## 2021-10-14 ENCOUNTER — Encounter: Payer: Self-pay | Admitting: Family Medicine

## 2021-10-14 VITALS — BP 132/80 | HR 76 | Temp 97.7°F | Ht 68.25 in | Wt 193.0 lb

## 2021-10-14 DIAGNOSIS — R7303 Prediabetes: Secondary | ICD-10-CM

## 2021-10-14 DIAGNOSIS — Z0000 Encounter for general adult medical examination without abnormal findings: Secondary | ICD-10-CM | POA: Diagnosis not present

## 2021-10-14 DIAGNOSIS — Z1211 Encounter for screening for malignant neoplasm of colon: Secondary | ICD-10-CM

## 2021-10-14 DIAGNOSIS — E786 Lipoprotein deficiency: Secondary | ICD-10-CM | POA: Diagnosis not present

## 2021-10-14 DIAGNOSIS — I10 Essential (primary) hypertension: Secondary | ICD-10-CM | POA: Diagnosis not present

## 2021-10-14 MED ORDER — AMLODIPINE BESYLATE 5 MG PO TABS: 5.0000 mg | ORAL_TABLET | Freq: Every day | ORAL | 3 refills | Status: DC

## 2021-10-14 MED ORDER — FLUTICASONE PROPIONATE 50 MCG/ACT NA SUSP: NASAL | 3 refills | Status: AC

## 2021-10-14 NOTE — Assessment & Plan Note (Signed)
Disc goals for lipids and reasons to control them Rev last labs with pt Rev low sat fat diet in detail Takes omega 3 supplement  Good diet  HDL stable at 33.3  Good exercise also  LDL of 104 , stable

## 2021-10-14 NOTE — Assessment & Plan Note (Signed)
bp in fair control at this time  BP Readings from Last 1 Encounters:  10/14/21 132/80   No changes needed Most recent labs reviewed  Disc lifstyle change with low sodium diet and exercise  Plan to continue amlodipine 5 mg daily

## 2021-10-14 NOTE — Assessment & Plan Note (Signed)
Reviewed health habits including diet and exercise and skin cancer prevention Reviewed appropriate screening tests for age  Also reviewed health mt list, fam hx and immunization status , as well as social and family history   See HPI Labs reviewed  Flu shot-plans for later in fall  colonoscopy utd 01/2021 No prostate changes  Good health habits Disc use of sun protection

## 2021-10-14 NOTE — Patient Instructions (Addendum)
Try to get most of your carbohydrates from produce (with the exception of white potatoes)  Eat less bread/pasta/rice/snack foods/cereals/sweets and other items from the middle of the grocery store (processed carbs)  Keep up the good work with exercise- once you are recovered from the concussion   Take care of yourself  Use sun protection

## 2021-10-14 NOTE — Assessment & Plan Note (Signed)
Lab Results  Component Value Date   HGBA1C 6.1 10/07/2021   disc imp of low glycemic diet and wt loss to prevent DM2
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
## 2021-10-14 NOTE — Assessment & Plan Note (Signed)
Colonoscopy utd 01/2021

## 2022-01-19 ENCOUNTER — Telehealth: Payer: BC Managed Care – PPO | Admitting: Nurse Practitioner

## 2022-01-19 ENCOUNTER — Encounter: Payer: Self-pay | Admitting: Family Medicine

## 2022-01-19 DIAGNOSIS — U071 COVID-19: Secondary | ICD-10-CM

## 2022-01-19 MED ORDER — BENZONATATE 100 MG PO CAPS: 100.0000 mg | ORAL_CAPSULE | Freq: Three times a day (TID) | ORAL | 0 refills | Status: DC | PRN

## 2022-01-19 MED ORDER — FLUTICASONE PROPIONATE 50 MCG/ACT NA SUSP: 2.0000 | Freq: Every day | NASAL | 6 refills | Status: DC

## 2022-01-19 MED ORDER — NAPROXEN 500 MG PO TABS: 500.0000 mg | ORAL_TABLET | Freq: Two times a day (BID) | ORAL | 0 refills | Status: DC

## 2022-01-19 NOTE — Progress Notes (Signed)
E-Visit  for Positive Covid Test Result  We are sorry you are not feeling well. We are here to help!  You have tested positive for COVID-19, meaning that you were infected with the novel coronavirus and could give the virus to others.  It is vitally important that you stay home so you do not spread it to others.      Since your COVID-19 symptoms started less than 5 days ago you may need a prescription for FDA-approved treatments (pills or IV therapy), which we cannot prescribe through an e-Visit. The best way for our providers to make a decision about which COVID treatment is right for you is through a Virtual Urgent Care Visit.  If you would like to discuss COVID therapy options with a provider, cancel this e-Visit and access a Virtual Urgent Care Visit from our Mychart menu.    Please continue isolation at home, for at least 10 days since the start of your symptoms and until you have had 24 hours with no fever (without taking a fever reducer) and with improving of symptoms.  If you have no symptoms but tested positive (or all symptoms resolve after 5 days and you have no fever) you can leave your house but continue to wear a mask around others for an additional 5 days. If you have a fever,continue to stay home until you have had 24 hours of no fever. Most cases improve 5-10 days from onset but we have seen a small number of patients who have gotten worse after the 10 days.  Please be sure to watch for worsening symptoms and remain taking the proper precautions.   Go to the nearest hospital ED for assessment if fever/cough/breathlessness are severe or illness seems like a threat to life.    The following symptoms may appear 2-14 days after exposure: Fever Cough Shortness of breath or difficulty breathing Chills Repeated shaking with chills Muscle pain Headache Sore throat New loss of taste or smell Fatigue Congestion or runny nose Nausea or vomiting Diarrhea  You have been enrolled in  MyChart Home Monitoring for COVID-19. Daily you will receive a questionnaire within the MyChart website. Our COVID-19 response team will be monitoring your responses daily.  You can use medication such as prescription cough medication called Tessalon Perles 100 mg. You may take 1-2 capsules every 8 hours as needed for cough, prescription anti-inflammatory called Naprosyn 500 mg. Take twice daily as needed for fever or body aches for 2 weeks, and prescription for Fluticasone nasal spray 2 sprays in each nostril one time per day  You may also take acetaminophen (Tylenol) as needed for fever.  HOME CARE: Only take medications as instructed by your medical team. Drink plenty of fluids and get plenty of rest. A steam or ultrasonic humidifier can help if you have congestion.   GET HELP RIGHT AWAY IF YOU HAVE EMERGENCY WARNING SIGNS.  Call 911 or proceed to your closest emergency facility if: You develop worsening high fever. Trouble breathing Bluish lips or face Persistent pain or pressure in the chest New confusion Inability to wake or stay awake You cough up blood. Your symptoms become more severe Inability to hold down food or fluids  This list is not all possible symptoms. Contact your medical provider for any symptoms that are severe or concerning to you.    Your e-visit answers were reviewed by a board certified advanced clinical practitioner to complete your personal care plan.  Depending on the condition, your plan could  have included both over the counter or prescription medications.  If there is a problem please reply once you have received a response from your provider.  Your safety is important to [place].  If you have drug allergies check your prescription carefully.    You can use MyChart to ask questions about today's visit, request a non-urgent call back, or ask for a work or school excuse for 24 hours related to this e-Visit. If it has been greater than 24 hours you will need to  follow up with your provider, or enter a new e-Visit to address those concerns. You will get an e-mail in the next two days asking about your experience.  I hope that your e-visit has been valuable and will speed your recovery. Thank you for using e-visits. I spent approximately 5 minutes reviewing the patient's history, current symptoms and coordinating their plan of care today.

## 2022-01-19 NOTE — Telephone Encounter (Signed)
Spoke to patient by telephone and was advised that he no  longer has a fever. Patient stated that he has chest congestion. Patient stated that he has been keeping a check on his oxygen level which has been 95%. Patient denies SOB or difficulty breathing. Patient stated that he has a bad cough and would like something for the cough. Patient was assisted on getting set up for an e-visit with [facility]. Patient was advised that the provider can discuss with him treatment options and he verbalized understanding. Patient stated that he appreciated the call back and assistance.

## 2022-01-28 NOTE — Telephone Encounter (Signed)
Pt did his e-visit and was able to get medication

## 2022-03-03 ENCOUNTER — Encounter: Payer: Self-pay | Admitting: Family Medicine

## 2022-03-03 NOTE — Telephone Encounter (Signed)
Please schedule in office visit .

## 2022-03-04 NOTE — Telephone Encounter (Signed)
Scheduled ov for 03/09/22

## 2022-03-09 ENCOUNTER — Ambulatory Visit: Payer: BC Managed Care – PPO | Admitting: Family Medicine

## 2022-03-09 ENCOUNTER — Encounter: Payer: Self-pay | Admitting: Family Medicine

## 2022-03-09 VITALS — BP 135/88 | HR 84 | Temp 97.6°F | Ht 68.25 in | Wt 209.0 lb

## 2022-03-09 DIAGNOSIS — R0981 Nasal congestion: Secondary | ICD-10-CM

## 2022-03-09 MED ORDER — PREDNISONE 10 MG PO TABS: ORAL_TABLET | ORAL | 0 refills | Status: DC

## 2022-03-09 NOTE — Patient Instructions (Addendum)
Put a very tiny bit of aquaphor or vaseline in your nostrils once daily at bedtime for a week   A humidifier in bedroom may help dryness also   Take prednisone 20 mg daily for 5 days May make you feel hyper and hungry   Watch for sinus pain/ worse pressure / colored mucous or more nose bleeds   Continue the flonase unless bleeding returns   Nasal saline spray is ok to use any time if you feel blocked  Nasal rinse is the same thing

## 2022-03-09 NOTE — Progress Notes (Signed)
Subjective:    Patient ID: Darryl Jenkins, male    DOB: 1975-07-20, 47 y.o.   MRN: [id number]  HPI Pt presents for c/o nasal congestion   Wt Readings from Last 3 Encounters:  03/09/22 209 lb (94.8 kg)  10/14/21 193 lb (87.5 kg)  09/24/21 195 lb 8 oz (88.7 kg)   31.55 kg/m  Vitals:   03/09/22 1451  BP: (!) 144/82  Pulse: 84  Temp: 97.6 F (36.4 C)  SpO2: 96%    Nasal congestion since having covid last month Worse on the left side  Comes and goes   One time had an actual bloody nose   Late last week- he noticed less bleeding  L nostril clogs up and opens up on and off   One spot of sinus/facial discomfort /pressure -l maxillary   No fever  No ST No cough  Ears feel ok /no pain/ not clogged    Mucous has occ blood in it   Not green or yellow Is clear    Uses flonase No other nasal sprays No saline    Otc  none  Patient Active Problem List   Diagnosis Date Noted   Nasal congestion 03/09/2022   Colon cancer screening 05/24/2017   Essential hypertension 05/24/2017   Prediabetes 09/16/2015   Low HDL (under 40) 07/14/2010   Routine general medical examination at a health care facility 07/10/2010   GERD (gastroesophageal reflux disease) 03/27/2009   Past Medical History:  Diagnosis Date   Chest pain, unspecified    Family history of ischemic heart disease    Hemorrhoids    Other malaise and fatigue    Pneumonia    Past Surgical History:  Procedure Laterality Date   COLONOSCOPY WITH PROPOFOL N/A 02/04/2021   Procedure: COLONOSCOPY WITH PROPOFOL;  Surgeon: Lucilla Lame, MD;  Location: [facility] ENDOSCOPY;  Service: Endoscopy;  Laterality: N/A;   Social History   Tobacco Use   Smoking status: Never   Smokeless tobacco: Never  Substance Use Topics   Alcohol use: Yes    Alcohol/week: 0.0 standard drinks of alcohol    Comment: rare   Drug use: No   Family History  Problem Relation Age of Onset   Breast cancer Mother    Hypertension Brother     Diabetes type II Brother    Hypertension Brother    Allergies  Allergen Reactions   Iohexol      Code: RASH, Desc: pt developed rash post 100 ml omni 350 for cta coronary exam. steroid taper prescribed after exam was complete. bsw 03/21/2009., Onset Date: [id number]    Current Outpatient Medications on File Prior to Visit  Medication Sig Dispense Refill   amLODipine (NORVASC) 5 MG tablet Take 1 tablet (5 mg total) by mouth daily. 90 tablet 3   fluticasone (FLONASE) 50 MCG/ACT nasal spray SPRAY 2 SPRAYS INTO EACH NOSTRIL EVERY DAY 48 mL 3   fluticasone (FLONASE) 50 MCG/ACT nasal spray Place 2 sprays into both nostrils daily. 16 g 6   Multiple Vitamin (MULTIVITAMIN) capsule Take 1 capsule by mouth 2 (two) times daily.     Omega-3 Fatty Acids (FISH OIL) 1000 MG CAPS Take 1 capsule by mouth 2 (two) times daily.     vitamin C (ASCORBIC ACID) 500 MG tablet Take 500 mg by mouth daily.     No current facility-administered medications on file prior to visit.    Review of Systems  Constitutional:  Negative for activity change, appetite change, fatigue, fever  and unexpected weight change.  HENT:  Positive for congestion. Negative for rhinorrhea, sore throat, trouble swallowing and voice change.   Eyes:  Negative for pain, redness, itching and visual disturbance.  Respiratory:  Negative for cough, chest tightness, shortness of breath and wheezing.   Cardiovascular:  Negative for chest pain and palpitations.  Gastrointestinal:  Negative for abdominal pain, blood in stool, constipation, diarrhea and nausea.  Endocrine: Negative for cold intolerance, heat intolerance, polydipsia and polyuria.  Genitourinary:  Negative for difficulty urinating, dysuria, frequency and urgency.  Musculoskeletal:  Negative for arthralgias, joint swelling and myalgias.  Skin:  Negative for pallor and rash.  Neurological:  Negative for dizziness, tremors, weakness, numbness and headaches.  Hematological:  Negative for  adenopathy. Does not bruise/bleed easily.  Psychiatric/Behavioral:  Negative for decreased concentration and dysphoric mood. The patient is not nervous/anxious.        Objective:   Physical Exam Constitutional:      General: He is not in acute distress.    Appearance: Normal appearance. He is obese. He is not ill-appearing.  HENT:     Head: Normocephalic and atraumatic.     Right Ear: Tympanic membrane and ear canal normal.     Left Ear: Tympanic membrane and ear canal normal.     Nose: Congestion present.     Comments: Mild nasal congestion  Nares are mildly injected and boggy  No scabs or lesions noted   No obstruction noted  No polyps visualized     Mouth/Throat:     Mouth: Mucous membranes are moist.     Pharynx: Oropharynx is clear. No posterior oropharyngeal erythema.  Eyes:     General:        Right eye: No discharge.        Left eye: No discharge.     Conjunctiva/sclera: Conjunctivae normal.     Pupils: Pupils are equal, round, and reactive to light.  Cardiovascular:     Rate and Rhythm: Normal rate and regular rhythm.     Heart sounds: Normal heart sounds.  Pulmonary:     Effort: Pulmonary effort is normal. No respiratory distress.     Breath sounds: Normal breath sounds. No stridor. No wheezing, rhonchi or rales.  Musculoskeletal:     Cervical back: Neck supple.  Lymphadenopathy:     Cervical: No cervical adenopathy.  Skin:    General: Skin is warm and dry.     Findings: No rash.  Neurological:     Mental Status: He is alert.     Cranial Nerves: No cranial nerve deficit.  Psychiatric:        Mood and Affect: Mood normal.           Assessment & Plan:   Problem List Items Addressed This Visit       Other   Nasal congestion - Primary    Worse in L nostril ever since he had covid Occ blood but mucous is clear and no sinus pain (minimal pressure) Reassuring exam   Px prednisone 20 mg daily for 5 d Nasal saline prn  Hold flonase if bleeding  returns and can use scant ointment in nostril at bedtime prn Steam/humidifier may help Rev s/s of bacterial sinusitis to watch for   Update if not starting to improve in a week or if worsening

## 2022-03-09 NOTE — Assessment & Plan Note (Signed)
Worse in L nostril ever since he had covid Occ blood but mucous is clear and no sinus pain (minimal pressure) Reassuring exam   Px prednisone 20 mg daily for 5 d Nasal saline prn  Hold flonase if bleeding returns and can use scant ointment in nostril at bedtime prn Steam/humidifier may help Rev s/s of bacterial sinusitis to watch for   Update if not starting to improve in a week or if worsening

## 2022-09-24 ENCOUNTER — Encounter (INDEPENDENT_AMBULATORY_CARE_PROVIDER_SITE_OTHER): Payer: Self-pay

## 2022-10-07 ENCOUNTER — Telehealth: Payer: Self-pay | Admitting: Family Medicine

## 2022-10-07 DIAGNOSIS — E786 Lipoprotein deficiency: Secondary | ICD-10-CM

## 2022-10-07 DIAGNOSIS — I10 Essential (primary) hypertension: Secondary | ICD-10-CM

## 2022-10-07 DIAGNOSIS — R7303 Prediabetes: Secondary | ICD-10-CM

## 2022-10-07 NOTE — Telephone Encounter (Signed)
-----   Message from Alvina Chou sent at 09/24/2022 12:12 PM EDT ----- Regarding: Lab orders for Friday, 8.30.24 Patient is scheduled for CPX labs, please order future labs, Thanks , Camelia Eng

## 2022-10-09 ENCOUNTER — Other Ambulatory Visit (INDEPENDENT_AMBULATORY_CARE_PROVIDER_SITE_OTHER): Payer: BC Managed Care – PPO

## 2022-10-09 DIAGNOSIS — I10 Essential (primary) hypertension: Secondary | ICD-10-CM

## 2022-10-09 DIAGNOSIS — E786 Lipoprotein deficiency: Secondary | ICD-10-CM

## 2022-10-09 DIAGNOSIS — R7303 Prediabetes: Secondary | ICD-10-CM

## 2022-10-09 LAB — CBC WITH DIFFERENTIAL/PLATELET
Basophils Absolute: 0.1 10*3/uL (ref 0.0–0.1)
Basophils Relative: 1.1 % (ref 0.0–3.0)
Eosinophils Absolute: 0.1 10*3/uL (ref 0.0–0.7)
Eosinophils Relative: 1.8 % (ref 0.0–5.0)
HCT: 42.6 % (ref 39.0–52.0)
Hemoglobin: 14.4 g/dL (ref 13.0–17.0)
Lymphocytes Relative: 32.2 % (ref 12.0–46.0)
Lymphs Abs: 1.6 10*3/uL (ref 0.7–4.0)
MCHC: 33.9 g/dL (ref 30.0–36.0)
MCV: 89.7 fl (ref 78.0–100.0)
Monocytes Absolute: 0.4 10*3/uL (ref 0.1–1.0)
Monocytes Relative: 8.2 % (ref 3.0–12.0)
Neutro Abs: 2.7 10*3/uL (ref 1.4–7.7)
Neutrophils Relative %: 56.7 % (ref 43.0–77.0)
Platelets: 308 10*3/uL (ref 150.0–400.0)
RBC: 4.75 Mil/uL (ref 4.22–5.81)
RDW: 12.8 % (ref 11.5–15.5)
WBC: 4.8 10*3/uL (ref 4.0–10.5)

## 2022-10-09 LAB — COMPREHENSIVE METABOLIC PANEL
ALT: 21 U/L (ref 0–53)
AST: 18 U/L (ref 0–37)
Albumin: 4.4 g/dL (ref 3.5–5.2)
Alkaline Phosphatase: 35 U/L — ABNORMAL LOW (ref 39–117)
BUN: 11 mg/dL (ref 6–23)
CO2: 28 mEq/L (ref 19–32)
Calcium: 9.5 mg/dL (ref 8.4–10.5)
Chloride: 103 mEq/L (ref 96–112)
Creatinine, Ser: 0.92 mg/dL (ref 0.40–1.50)
GFR: 98.56 mL/min (ref >60.00)
Glucose, Bld: 105 mg/dL — ABNORMAL HIGH (ref 70–99)
Potassium: 4.8 mEq/L (ref 3.5–5.1)
Sodium: 138 mEq/L (ref 135–145)
Total Bilirubin: 1.6 mg/dL — ABNORMAL HIGH (ref 0.2–1.2)
Total Protein: 6.6 g/dL (ref 6.0–8.3)

## 2022-10-09 LAB — LIPID PANEL
Cholesterol: 157 mg/dL (ref 0–200)
HDL: 32.2 mg/dL — ABNORMAL LOW (ref >39.00)
LDL Cholesterol: 106 mg/dL — ABNORMAL HIGH (ref 0–99)
NonHDL: 124.33
Total CHOL/HDL Ratio: 5
Triglycerides: 93 mg/dL (ref 0.0–149.0)
VLDL: 18.6 mg/dL (ref 0.0–40.0)

## 2022-10-09 LAB — TSH: TSH: 2.94 u[IU]/mL (ref 0.35–5.50)

## 2022-10-09 LAB — HEMOGLOBIN A1C: Hgb A1c MFr Bld: 5.9 % (ref 4.6–6.5)

## 2022-10-15 ENCOUNTER — Other Ambulatory Visit: Payer: Self-pay | Admitting: Nurse Practitioner

## 2022-10-15 DIAGNOSIS — U071 COVID-19: Secondary | ICD-10-CM

## 2022-10-16 ENCOUNTER — Ambulatory Visit (INDEPENDENT_AMBULATORY_CARE_PROVIDER_SITE_OTHER): Payer: BC Managed Care – PPO | Admitting: Family Medicine

## 2022-10-16 ENCOUNTER — Encounter: Payer: Self-pay | Admitting: Family Medicine

## 2022-10-16 VITALS — BP 108/70 | HR 72 | Temp 97.8°F | Ht 68.25 in | Wt 200.0 lb

## 2022-10-16 DIAGNOSIS — E786 Lipoprotein deficiency: Secondary | ICD-10-CM | POA: Diagnosis not present

## 2022-10-16 DIAGNOSIS — R7303 Prediabetes: Secondary | ICD-10-CM

## 2022-10-16 DIAGNOSIS — I10 Essential (primary) hypertension: Secondary | ICD-10-CM

## 2022-10-16 DIAGNOSIS — Z1211 Encounter for screening for malignant neoplasm of colon: Secondary | ICD-10-CM

## 2022-10-16 DIAGNOSIS — Z0000 Encounter for general adult medical examination without abnormal findings: Secondary | ICD-10-CM

## 2022-10-16 MED ORDER — AMLODIPINE BESYLATE 5 MG PO TABS: 5.0000 mg | ORAL_TABLET | Freq: Every day | ORAL | 3 refills | Status: DC

## 2022-10-16 NOTE — Progress Notes (Unsigned)
Subjective:    Patient ID: Darryl Jenkins, male    DOB: 1976-02-04, 47 y.o.   MRN: [id number]  HPI  Here for health maintenance exam and to review chronic medical problems   Wt Readings from Last 3 Encounters:  10/16/22 200 lb (90.7 kg)  03/09/22 209 lb (94.8 kg)  10/14/21 193 lb (87.5 kg)   30.19 kg/m   Vitals:   10/16/22 0829  BP: 108/70  Pulse: 72  Temp: 97.8 F (36.6 C)  SpO2: 94%    Immunization History  Administered Date(s) Administered   Influenza Whole 12/10/2001   Influenza,inj,Quad PF,6+ Mos 12/06/2017, 10/07/2018   Influenza-Unspecified 12/05/2012, 12/09/2014   PPD Test 09/07/2017   Pneumococcal Polysaccharide-23 02/09/2002, 04/17/2009   Td 02/09/1998, 04/17/2009, 10/09/2019    Health Maintenance Due  Topic Date Due   Hepatitis C Screening  Never done   Wants flu shot later in the fall   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   Tries to manage the stress   Feels ok overall   Joined a new gym - insurance pays 1/2  4-5 days per week he works out  [employer] first thing in the am   Eating cleaner also   Intermittent fasting 12-14 hours per day   Prostate health No problems  Nocturia 0-1 (only a few times per week)    Colon cancer screening  colonoscopy 01/2021   Bone health   Falls- none  Fractures-none  Supplements - vitamin D in his mvi  Exercise - strength training   HTN bp is stable today  No cp or palpitations or headaches or edema  No side effects to medicines  BP Readings from Last 3 Encounters:  10/16/22 108/70  03/09/22 135/88  10/14/21 132/80    Amlodipine 5 mg daily   Pulse Readings from Last 3 Encounters:  10/16/22 72  03/09/22 84  10/14/21 76       Mood    10/16/2022    8:32 AM 03/09/2022    3:06 PM 10/14/2021    8:56 AM 10/09/2020    9:10 AM 10/09/2019    9:30 AM  Depression screen PHQ 2/9  Decreased Interest 0 0 0 0 0  Down, Depressed, Hopeless 0 0 0 0 0  PHQ - 2 Score  0 0 0 0 0  Altered sleeping 0 0 0 0 0  Tired, decreased energy 0 0 0 1 1  Change in appetite 0 0 0 0 0  Feeling bad or failure about yourself  0 0 0 0 0  Trouble concentrating 0 0 0 0 0  Moving slowly or fidgety/restless 0 0 0 0 0  Suicidal thoughts 0 0 0 0 0  PHQ-9 Score 0 0 0 1 1  Difficult doing work/chores Not difficult at all Not difficult at all Not difficult at all Not difficult at all Not difficult at all    Hyperlipidemia  Lab Results  Component Value Date   CHOL 157 10/09/2022   CHOL 159 10/07/2021   CHOL 162 10/07/2020   Lab Results  Component Value Date   HDL 32.20 (L) 10/09/2022   HDL 33.30 (L) 10/07/2021   HDL 36.80 (L) 10/07/2020   Lab Results  Component Value Date   LDLCALC 106 (H) 10/09/2022   LDLCALC 104 (H) 10/07/2021   LDLCALC 106 (H) 10/07/2020   Lab Results  Component Value Date   TRIG 93.0 10/09/2022   TRIG 112.0 10/07/2021  TRIG 97.0 10/07/2020   Lab Results  Component Value Date   CHOLHDL 5 10/09/2022   CHOLHDL 5 10/07/2021   CHOLHDL 4 10/07/2020   No results found for: "LDLDIRECT" Low HDL is chronic   Takes fish oil   Prediabetes Lab Results  Component Value Date   HGBA1C 5.9 10/09/2022   Down from 6.1  Brother has dm2  Lab Results  Component Value Date   WBC 4.8 10/09/2022   HGB 14.4 10/09/2022   HCT 42.6 10/09/2022   MCV 89.7 10/09/2022   PLT 308.0 10/09/2022   Lab Results  Component Value Date   ALT 21 10/09/2022   AST 18 10/09/2022   ALKPHOS 35 (L) 10/09/2022   BILITOT 1.6 (H) 10/09/2022    Lab Results  Component Value Date   WBC 4.8 10/09/2022   HGB 14.4 10/09/2022   HCT 42.6 10/09/2022   MCV 89.7 10/09/2022   PLT 308.0 10/09/2022   Lab Results  Component Value Date   TSH 2.94 10/09/2022      Patient Active Problem List   Diagnosis Date Noted   Nasal congestion 03/09/2022   Colon cancer screening 05/24/2017   Essential hypertension 05/24/2017   Prediabetes 09/16/2015   Low HDL (under 40)  07/14/2010   Routine general medical examination at a health care facility 07/10/2010   GERD (gastroesophageal reflux disease) 03/27/2009   Past Medical History:  Diagnosis Date   Chest pain, unspecified    Family history of ischemic heart disease    Hemorrhoids    Other malaise and fatigue    Pneumonia    Past Surgical History:  Procedure Laterality Date   COLONOSCOPY WITH PROPOFOL N/A 02/04/2021   Procedure: COLONOSCOPY WITH PROPOFOL;  Surgeon: Midge Minium, MD;  Location: [facility] ENDOSCOPY;  Service: Endoscopy;  Laterality: N/A;   Social History   Tobacco Use   Smoking status: Never   Smokeless tobacco: Never  Substance Use Topics   Alcohol use: Yes    Alcohol/week: 0.0 standard drinks of alcohol    Comment: rare   Drug use: No   Family History  Problem Relation Age of Onset   Breast cancer Mother    Hypertension Brother    Diabetes type II Brother    Hypertension Brother    Allergies  Allergen Reactions   Iohexol      Code: RASH, Desc: pt developed rash post 100 ml omni 350 for cta coronary exam. steroid taper prescribed after exam was complete. bsw 03/21/2009., Onset Date: [id number]    Current Outpatient Medications on File Prior to Visit  Medication Sig Dispense Refill   amLODipine (NORVASC) 5 MG tablet Take 1 tablet (5 mg total) by mouth daily. 90 tablet 3   fluticasone (FLONASE) 50 MCG/ACT nasal spray SPRAY 2 SPRAYS INTO EACH NOSTRIL EVERY DAY 48 mL 3   Multiple Vitamin (MULTIVITAMIN) capsule Take 1 capsule by mouth 2 (two) times daily.     Omega-3 Fatty Acids (FISH OIL) 1000 MG CAPS Take 1 capsule by mouth 2 (two) times daily.     vitamin C (ASCORBIC ACID) 500 MG tablet Take 500 mg by mouth daily.     No current facility-administered medications on file prior to visit.    Review of Systems     Objective:   Physical Exam        Assessment & Plan:   Problem List Items Addressed This Visit   None

## 2022-10-16 NOTE — Patient Instructions (Signed)
Take care of yourself  Keep exercising   Make sure to get enough protein Try to get most of your carbohydrates from produce (with the exception of white potatoes) and whole grains Eat less bread/pasta/rice/snack foods/cereals/sweets and other items from the middle of the grocery store (processed carbs)   Keep up the good work with exercise         Mediterranean Diet  Why follow it? Research shows. Those who follow the Mediterranean diet have a reduced risk of heart disease  The diet is associated with a reduced incidence of Parkinson's and Alzheimer's diseases People following the diet may have longer life expectancies and lower rates of chronic diseases  The Dietary Guidelines for Americans recommends the Mediterranean diet as an eating plan to promote health and prevent disease  What Is the Mediterranean Diet?  Healthy eating plan based on typical foods and recipes of Mediterranean-style cooking The diet is primarily a plant based diet; these foods should make up a majority of meals   Starches - Plant based foods should make up a majority of meals - They are an important sources of vitamins, minerals, energy, antioxidants, and fiber - Choose whole grains, foods high in fiber and minimally processed items  - Typical grain sources include wheat, oats, barley, corn, brown rice, bulgar, farro, millet, polenta, couscous  - Various types of beans include chickpeas, lentils, fava beans, black beans, white beans   Fruits  Veggies - Large quantities of antioxidant rich fruits & veggies; 6 or more servings  - Vegetables can be eaten raw or lightly drizzled with oil and cooked  - Vegetables common to the traditional Mediterranean Diet include: artichokes, arugula, beets, broccoli, brussel sprouts, cabbage, carrots, celery, collard greens, cucumbers, eggplant, kale, leeks, lemons, lettuce, mushrooms, okra, onions, peas, peppers, potatoes, pumpkin, radishes, rutabaga, shallots, spinach, sweet  potatoes, turnips, zucchini - Fruits common to the Mediterranean Diet include: apples, apricots, avocados, cherries, clementines, dates, figs, grapefruits, grapes, melons, nectarines, oranges, peaches, pears, pomegranates, strawberries, tangerines  Fats - Replace butter and margarine with healthy oils, such as olive oil, canola oil, and tahini  - Limit nuts to no more than a handful a day  - Nuts include walnuts, almonds, pecans, pistachios, pine nuts  - Limit or avoid candied, honey roasted or heavily salted nuts - Olives are central to the [employer] - can be eaten whole or used in a variety of dishes   Meats Protein - Limiting red meat: no more than a few times a month - When eating red meat: choose lean cuts and keep the portion to the size of deck of cards - Eggs: approx. 0 to 4 times a week  - Fish and lean poultry: at least 2 a week  - Healthy protein sources include, chicken, [place], lean beef, lamb - Increase intake of seafood such as tuna, salmon, trout, mackerel, shrimp, scallops - Avoid or limit high fat processed meats such as sausage and bacon  Dairy - Include moderate amounts of low fat dairy products  - Focus on healthy dairy such as fat free yogurt, skim milk, low or reduced fat cheese - Limit dairy products higher in fat such as whole or 2% milk, cheese, ice cream  Alcohol - Moderate amounts of red wine is ok  - No more than 5 oz daily for women (all ages) and men older than age 58  - No more than 10 oz of wine daily for men younger than 34  Other - Limit sweets  and other desserts  - Use herbs and spices instead of salt to flavor foods  - Herbs and spices common to the traditional Mediterranean Diet include: basil, bay leaves, chives, cloves, cumin, fennel, garlic, lavender, marjoram, mint, oregano, parsley, pepper, rosemary, sage, savory, sumac, tarragon, thyme   It's not just a diet, it's a lifestyle:  The Mediterranean diet includes lifestyle factors typical of  those in the region  Foods, drinks and meals are best eaten with others and savored Daily physical activity is important for overall good health This could be strenuous exercise like running and aerobics This could also be more leisurely activities such as walking, housework, yard-work, or taking the stairs Moderation is the key; a balanced and healthy diet accommodates most foods and drinks Consider portion sizes and frequency of consumption of certain foods   Meal Ideas & Options:  Breakfast:  Whole wheat toast or whole wheat English muffins with peanut butter & hard boiled egg Steel cut oats topped with apples & cinnamon and skim milk  Fresh fruit: banana, strawberries, melon, berries, peaches  Smoothies: strawberries, bananas, greek yogurt, peanut butter Low fat greek yogurt with blueberries and granola  Egg white omelet with spinach and mushrooms Breakfast couscous: whole wheat couscous, apricots, skim milk, cranberries  Sandwiches:  Hummus and grilled vegetables (peppers, zucchini, squash) on whole wheat bread   Grilled chicken on whole wheat pita with lettuce, tomatoes, cucumbers or tzatziki  [place] salad on whole wheat bread: tuna salad made with greek yogurt, olives, red peppers, capers, green onions Garlic rosemary lamb pita: lamb sauted with garlic, rosemary, salt & pepper; add lettuce, cucumber, greek yogurt to pita - flavor with lemon juice and black pepper  Seafood:  Mediterranean grilled salmon, seasoned with garlic, basil, parsley, lemon juice and black pepper Shrimp, lemon, and spinach whole-grain pasta salad made with low fat greek yogurt  Seared scallops with lemon orzo  Seared tuna steaks seasoned salt, pepper, coriander topped with tomato mixture of olives, tomatoes, olive oil, minced garlic, parsley, green onions and cappers  Meats:  Herbed greek chicken salad with kalamata olives, cucumber, feta  Red bell peppers stuffed with spinach, bulgur, lean ground beef (or  lentils) & topped with feta   Kebabs: skewers of chicken, tomatoes, onions, zucchini, squash  [place] burgers: made with red onions, mint, dill, lemon juice, feta cheese topped with roasted red peppers Vegetarian Cucumber salad: cucumbers, artichoke hearts, celery, red onion, feta cheese, tossed in olive oil & lemon juice  Hummus and whole grain pita points with a greek salad (lettuce, tomato, feta, olives, cucumbers, red onion) Lentil soup with celery, carrots made with vegetable broth, garlic, salt and pepper  Tabouli salad: parsley, bulgur, mint, scallions, cucumbers, tomato, radishes, lemon juice, olive oil, salt and pepper.

## 2022-10-18 NOTE — Assessment & Plan Note (Signed)
Disc goals for lipids and reasons to control them Rev last labs with pt Rev low sat fat diet in detail HDL is stable despite fish oil and exercise  LDL 106  Will continue to follwo

## 2022-10-18 NOTE — Assessment & Plan Note (Signed)
Reviewed health habits including diet and exercise and skin cancer prevention Reviewed appropriate screening tests for age  Also reviewed health mt list, fam hx and immunization status , as well as social and family history   See HPI Labs reviewed and ordered No prostate concerns Colonoscopy utd 01/2021  Good exercise PHQ of 0

## 2022-10-18 NOTE — Assessment & Plan Note (Signed)
Lab Results  Component Value Date   HGBA1C 5.9 10/09/2022  Improved from 6.1 with better diet  disc imp of low glycemic diet and wt loss to prevent DM2

## 2022-10-18 NOTE — Assessment & Plan Note (Signed)
Colonoscopy utd 2022

## 2022-10-18 NOTE — Assessment & Plan Note (Signed)
bp in fair control at this time  BP Readings from Last 1 Encounters:  10/16/22 108/70   No changes needed Most recent labs reviewed  Disc lifstyle change with low sodium diet and exercise  Plan to continue amlodipine 5 mg daily

## 2023-01-21 ENCOUNTER — Encounter: Payer: Self-pay | Admitting: Family Medicine

## 2023-01-21 ENCOUNTER — Ambulatory Visit
Admission: EM | Admit: 2023-01-21 | Discharge: 2023-01-21 | Disposition: A | Discharge status: Home / Self Care | Payer: BC Managed Care – PPO | Attending: Emergency Medicine | Admitting: Emergency Medicine

## 2023-01-21 DIAGNOSIS — J209 Acute bronchitis, unspecified: Secondary | ICD-10-CM | POA: Diagnosis not present

## 2023-01-21 DIAGNOSIS — J101 Influenza due to other identified influenza virus with other respiratory manifestations: Secondary | ICD-10-CM

## 2023-01-21 LAB — POC COVID19/FLU A&B COMBO
Covid Antigen, POC: NEGATIVE
Influenza A Antigen, POC: POSITIVE — AB
Influenza B Antigen, POC: POSITIVE — AB

## 2023-01-21 MED ORDER — DEXAMETHASONE 6 MG PO TABS: 6.0000 mg | ORAL_TABLET | Freq: Two times a day (BID) | ORAL | 0 refills | Status: AC

## 2023-01-21 MED ORDER — ALBUTEROL SULFATE HFA 108 (90 BASE) MCG/ACT IN AERS: 2.0000 | INHALATION_SPRAY | Freq: Four times a day (QID) | RESPIRATORY_TRACT | 2 refills | Status: DC | PRN

## 2023-01-21 MED ORDER — PROMETHAZINE-DM 6.25-15 MG/5ML PO SYRP
5.0000 mL | ORAL_SOLUTION | Freq: Every evening | ORAL | 0 refills | Status: DC | PRN
Start: 2023-01-21 — End: 2023-02-04

## 2023-01-21 MED ORDER — GUAIFENESIN 400 MG PO TABS: ORAL_TABLET | ORAL | 0 refills | Status: DC

## 2023-01-21 MED ORDER — OSELTAMIVIR PHOSPHATE 75 MG PO CAPS: 75.0000 mg | ORAL_CAPSULE | Freq: Two times a day (BID) | ORAL | 0 refills | Status: AC

## 2023-01-21 NOTE — ED Provider Notes (Signed)
Darryl Jenkins    CSN: [id number] Arrival date & time: 01/21/23  1815    HISTORY   Chief Complaint  Patient presents with   Cough   Fever   HPI Darryl Jenkins is a pleasant, 47 y.o. male who presents to urgent care today. Patient complains of acute onset of fever, chills, Tmax 104 5:30 PM today, nonproductive cough that burns in his throat, body aches, loss of appetite today.  Patient is wrapped in a blanket in the exam room with his wife sitting by.  Patient denies nausea, vomiting, diarrhea, otalgia, headache, loss of taste or smell.  Patient states has been taking Tylenol cold and flu, last dose was at 4:30 PM, has a temperature of 99.9 on arrival.  Per EMR, patient received his influenza vaccine in August of this year.  The history is provided by the patient.   Past Medical History:  Diagnosis Date   Chest pain, unspecified    Family history of ischemic heart disease    Hemorrhoids    Other malaise and fatigue    Pneumonia    Patient Active Problem List   Diagnosis Date Noted   Colon cancer screening 05/24/2017   Essential hypertension 05/24/2017   Prediabetes 09/16/2015   Low HDL (under 40) 07/14/2010   Routine general medical examination at a health care facility 07/10/2010   GERD (gastroesophageal reflux disease) 03/27/2009   Past Surgical History:  Procedure Laterality Date   COLONOSCOPY WITH PROPOFOL N/A 02/04/2021   Procedure: COLONOSCOPY WITH PROPOFOL;  Surgeon: Midge Minium, MD;  Location: [facility];  Service: Endoscopy;  Laterality: N/A;    Home Medications    Prior to Admission medications   Medication Sig Start Date End Date Taking? Authorizing Provider  amLODipine (NORVASC) 5 MG tablet Take 1 tablet (5 mg total) by mouth daily. 10/16/22  Yes Tower, Audrie Gallus, MD  fluticasone (FLONASE) 50 MCG/ACT nasal spray SPRAY 2 SPRAYS INTO EACH NOSTRIL EVERY DAY 10/14/21  Yes Tower, Audrie Gallus, MD  Multiple Vitamin (MULTIVITAMIN) capsule Take 1 capsule by  mouth 2 (two) times daily.   Yes [provider]  Omega-3 Fatty Acids (FISH OIL) 1000 MG CAPS Take 1 capsule by mouth 2 (two) times daily.   Yes [provider]  vitamin C (ASCORBIC ACID) 500 MG tablet Take 500 mg by mouth daily.   Yes [provider]    Family History Family History  Problem Relation Age of Onset   Breast cancer Mother    Hypertension Brother    Diabetes type II Brother    Hypertension Brother    Social History Social History   Tobacco Use   Smoking status: Never   Smokeless tobacco: Never  Substance Use Topics   Alcohol use: Yes    Alcohol/week: 0.0 standard drinks of alcohol    Comment: rare   Drug use: No   Allergies   Iohexol  Review of Systems Review of Systems Pertinent findings revealed after performing a 14 point review of systems has been noted in the history of present illness.  Physical Exam Vital Signs BP (!) 150/86 (BP Location: Left Arm)   Pulse (!) 103   Temp 99.9 F (37.7 C) (Oral)   Resp 18   SpO2 94%   No data found.  Physical Exam Vitals and nursing note reviewed.  Constitutional:      General: He is awake. He is not in acute distress.    Appearance: Normal appearance. He is well-developed and well-groomed.  He is ill-appearing.  HENT:     Head: Normocephalic and atraumatic.     Salivary Glands: Right salivary gland is not diffusely enlarged or tender. Left salivary gland is not diffusely enlarged or tender.     Right Ear: External ear normal. Tympanic membrane is erythematous.     Left Ear: External ear normal. Tympanic membrane is erythematous.     Ears:     Comments: Bilateral EACs with mild edema and erythema    Nose: Mucosal edema, congestion and rhinorrhea present. Rhinorrhea is clear.     Right Sinus: No maxillary sinus tenderness or frontal sinus tenderness.     Left Sinus: No maxillary sinus tenderness.     Mouth/Throat:     Lips: Pink.     Mouth: Mucous membranes are moist.      Tongue: No lesions. Tongue does not deviate from midline.     Palate: No mass and lesions.     Pharynx: Uvula midline. Pharyngeal swelling, posterior oropharyngeal erythema and uvula swelling present. No oropharyngeal exudate.     Tonsils: No tonsillar exudate. 0 on the right. 0 on the left.  Eyes:     General: Lids are normal.     Pupils: Pupils are equal, round, and reactive to light.  Cardiovascular:     Rate and Rhythm: Regular rhythm. Tachycardia present.     Pulses: Normal pulses.     Heart sounds: Normal heart sounds, S1 normal and S2 normal.  Pulmonary:     Effort: Pulmonary effort is normal. No tachypnea, bradypnea, accessory muscle usage, prolonged expiration or respiratory distress.     Breath sounds: Normal breath sounds and air entry. No stridor, decreased air movement or transmitted upper airway sounds. No decreased breath sounds, wheezing, rhonchi or rales.     Comments: Coarse breath sounds throughout without wheeze, rale, rhonchi. Abdominal:     General: Abdomen is flat. Bowel sounds are normal.     Palpations: Abdomen is soft.  Musculoskeletal:        General: Normal range of motion.     Cervical back: Full passive range of motion without pain, normal range of motion and neck supple.  Lymphadenopathy:     Cervical: Cervical adenopathy present.     Right cervical: Superficial cervical adenopathy and posterior cervical adenopathy present.     Left cervical: Superficial cervical adenopathy and posterior cervical adenopathy present.  Skin:    General: Skin is warm and dry.  Neurological:     General: No focal deficit present.     Mental Status: He is alert and oriented to person, place, and time.     Motor: Motor function is intact.     Coordination: Coordination is intact.     Gait: Gait is intact.     Deep Tendon Reflexes: Reflexes are normal and symmetric.  Psychiatric:        Attention and Perception: Attention and perception normal.        Mood and Affect: Mood  and affect normal.        Speech: Speech normal.        Behavior: Behavior normal. Behavior is cooperative.        Thought Content: Thought content normal.     Visual Acuity Right Eye Distance:   Left Eye Distance:   Bilateral Distance:    Right Eye Near:   Left Eye Near:    Bilateral Near:     UC Couse / Diagnostics / Procedures:     Radiology  No results found.  Procedures Procedures (including critical care time) EKG  Pending results:  Labs Reviewed  POC COVID19/FLU A&B COMBO - Abnormal; Notable for the following components:      Result Value   Influenza A Antigen, POC Positive (*)    Influenza B Antigen, POC Positive (*)    All other components within normal limits    Medications Ordered in UC: Medications - No data to display  UC Diagnoses / Final Clinical Impressions(s)   I have reviewed the triage vital signs and the nursing notes.  Pertinent labs & imaging results that were available during my care of the patient were reviewed by me and considered in my medical decision making (see chart for details).    Final diagnoses:  Influenza A  Influenza B  Acute bronchitis, unspecified organism   Patient's rapid COVID-19 and influenza test was negative for COVID-19 and positive for both influenza A and B.  Will defer repeat testing to confirm test given patient's rapid onset of symptoms, high fever and physical exam findings concerning for influenza.  Patient provided with a prescription for Tamiflu.  Patient was provided with a prescription for albuterol as he states he typically needs albuterol whenever he gets bronchitis.  Patient provided with low-dose of Decadron to help with bronchitis as well.  Patient provided with guaifenesin to promote expectoration and Promethazine DM for nighttime cough so we can get some sleep.  Family members at home all provided with prescriptions for Tamiflu for preventative treatment.  Conservative care recommended.  Return precautions  advised.  Please see discharge instructions below for details of plan of care as provided to patient. ED Prescriptions     Medication Sig Dispense Auth. Provider   oseltamivir (TAMIFLU) 75 MG capsule Take 1 capsule (75 mg total) by mouth every 12 (twelve) hours for 5 days. 10 capsule Theadora Rama Scales, PA-C   promethazine-dextromethorphan (PROMETHAZINE-DM) 6.25-15 MG/5ML syrup Take 5 mLs by mouth at bedtime as needed for cough. 60 mL Theadora Rama Scales, PA-C   guaifenesin (HUMIBID E) 400 MG TABS tablet Take 1 tablet 3 times daily as needed for chest congestion and cough 30 tablet Theadora Rama Scales, PA-C   dexamethasone (DECADRON) 6 MG tablet Take 1 tablet (6 mg total) by mouth 2 (two) times daily with a meal for 5 days. 10 tablet Theadora Rama Scales, PA-C   albuterol (VENTOLIN HFA) 108 (90 Base) MCG/ACT inhaler Inhale 2 puffs into the lungs every 6 (six) hours as needed for wheezing or shortness of breath (Cough). 18 g Theadora Rama Scales, PA-C      PDMP not reviewed this encounter.  Pending results:  Labs Reviewed  POC COVID19/FLU A&B COMBO - Abnormal; Notable for the following components:      Result Value   Influenza A Antigen, POC Positive (*)    Influenza B Antigen, POC Positive (*)    All other components within normal limits      Discharge Instructions      Your rapid influenza antigen test today was positive for influenza A and influenza B.  I recommend that you begin taking Tamiflu now for treatment of influenza.  Tamiflu will reduce the amount of virus in your body and stop it from reproducing.  This will help keep you from feeling any sicker and help you feel better sooner.  I have sent a prescription to your pharmacy, please take 1 capsule twice daily for the next 5 days.  Members of your household should also  be treated with Tamiflu as well as a preventative measure.  Your COVID-19 test is negative.  Please consider retesting in the next 2 to 3 days,  particularly if you are not feeling any better.  You are welcome to return here to urgent care to have it done or you can take a home COVID-19 test.  If both your COVID-19 tests are negative, then you can safely assume that your illness is due to one of the many less serious illnesses circulating in our community right now.     Conservative care is recommended with rest, drinking plenty of clear fluids, eating only when hungry, taking supportive medications for your symptoms and avoiding being around other people.  Please remain at home until you are fever free for 24 hours without the use of antifever medications such as Tylenol and ibuprofen.    Please read below to learn more about the medications, dosages and frequencies that I recommend to help alleviate your symptoms and to get you feeling better soon:   Decadron (dexamethasone):  To quickly address your significant respiratory inflammation, please begin a low-dose of Decadron twice daily for the next 5 days, this replaces nonsteroidal anti-inflammatory pain medications such as ibuprofen and naproxen because it will reduce inflammation better.    ProAir, Ventolin, Proventil (albuterol): This inhaled medication contains a short acting beta agonist bronchodilator.  This medication relaxes the smooth muscle of the airway in the lungs.  When these muscles are tight, breathing becomes more constricted.  The result of relaxation of the smooth muscle is increased air movement and improved work of breathing.  This is a short acting medication that can be used every 4-6 hours as needed for increased work of breathing, shortness of breath, wheezing and excessive coughing.  It comes in the form of a handheld inhaler or nebulizer solution.  I recommended that for the next 3 to 4 days, this medication is used 4 times daily on a scheduled basis then decrease to twice daily and as needed until symptoms have completely resolved which I anticipate will be several  weeks.   Robitussin, Mucinex (guaifenesin): This is an expectorant.  This single symptom reliever helps break up chest congestion and loosen up thick nasal drainage making phlegm and drainage easier to cough up and to blow out from your nose.  I recommend taking 400 mg in either liquid or tablet form three times daily as needed.  I do not recommend the 12-hour extended relief version or doses higher than 400 mg per each dose as these often make some patients feel jittery or jumpy and can interfere with sleep.  I also do not recommend that you purchase guaifenesin with the ingredient " DM" which is dextromethorphan, a cough suppressant and that you plan on taking it at bedtime.  Guaifenesin 400 mg is a safe dose for people who are being treated for high blood pressure.  This medication is available over-the-counter.   Promethazine DM: Promethazine is both a nasal decongestant that dries up mucous membranes and an antinausea medication.  Promethazine often makes most patients feel fairly sleepy.  "DM" is dextromethorphan, a single symptom reliever which is a cough suppressant found in many over-the-counter cough medications and combination cold preparations.  Please take 5 mL before bedtime to minimize your cough which will help you sleep better.  I have sent a prescription for this medication to your pharmacy because it cannot be purchased over-the-counter.   If symptoms have not meaningfully improved in the  next 5 to 7 days, please return for repeat evaluation or follow-up with your regular provider.  If symptoms have worsened in the next 3 to 5 days, please return for repeat evaluation or follow-up with your regular provider.    Thank you for visiting urgent care today.  We appreciate the opportunity to participate in your care.       Disposition Upon Discharge:  Condition: stable for discharge home  Patient presented with an acute illness with associated systemic symptoms and significant  discomfort requiring urgent management. In my opinion, this is a condition that a prudent lay person (someone who possesses an average knowledge of health and medicine) may potentially expect to result in complications if not addressed urgently such as respiratory distress, impairment of bodily function or dysfunction of bodily organs.   Routine symptom specific, illness specific and/or disease specific instructions were discussed with the patient and/or caregiver at length.   As such, the patient has been evaluated and assessed, work-up was performed and treatment was provided in alignment with urgent care protocols and evidence based medicine.  Patient/parent/caregiver has been advised that the patient may require follow up for further testing and treatment if the symptoms continue in spite of treatment, as clinically indicated and appropriate.  Patient/parent/caregiver has been advised to return to the [facility] or PCP if no better; to PCP or the Emergency Department if new signs and symptoms develop, or if the current signs or symptoms continue to change or worsen for further workup, evaluation and treatment as clinically indicated and appropriate  The patient will follow up with their current PCP if and as advised. If the patient does not currently have a PCP we will assist them in obtaining one.   The patient may need specialty follow up if the symptoms continue, in spite of conservative treatment and management, for further workup, evaluation, consultation and treatment as clinically indicated and appropriate.  Patient/parent/caregiver verbalized understanding and agreement of plan as discussed.  All questions were addressed during visit.  Please see discharge instructions below for further details of plan.  This office note has been dictated using Teaching laboratory technician.  Unfortunately, this method of dictation can sometimes lead to typographical or grammatical errors.  I apologize for  your inconvenience in advance if this occurs.  Please do not hesitate to reach out to me if clarification is needed.      Theadora Rama Scales, PA-C 01/21/23 1927

## 2023-01-21 NOTE — ED Triage Notes (Signed)
Pt presents with cough, fever 104 at home today, body aches, chills that started today. Taking tylenol.

## 2023-01-21 NOTE — ED Notes (Signed)
Pt positive for flu A & B, per box to re-test patient if 2 or more show positive. Per provider will defer re-testing due to pt requesting no re-test and pt having symptoms of both flu A & B and provider will treat according.

## 2023-01-21 NOTE — Discharge Instructions (Addendum)
Your rapid influenza antigen test today was positive for influenza A and influenza B.  I recommend that you begin taking Tamiflu now for treatment of influenza.  Tamiflu will reduce the amount of virus in your body and stop it from reproducing.  This will help keep you from feeling any sicker and help you feel better sooner.  I have sent a prescription to your pharmacy, please take 1 capsule twice daily for the next 5 days.  Members of your household should also be treated with Tamiflu as well as a preventative measure.  Your COVID-19 test is negative.  Please consider retesting in the next 2 to 3 days, particularly if you are not feeling any better.  You are welcome to return here to urgent care to have it done or you can take a home COVID-19 test.  If both your COVID-19 tests are negative, then you can safely assume that your illness is due to one of the many less serious illnesses circulating in our community right now.     Conservative care is recommended with rest, drinking plenty of clear fluids, eating only when hungry, taking supportive medications for your symptoms and avoiding being around other people.  Please remain at home until you are fever free for 24 hours without the use of antifever medications such as Tylenol and ibuprofen.    Please read below to learn more about the medications, dosages and frequencies that I recommend to help alleviate your symptoms and to get you feeling better soon:   Decadron (dexamethasone):  To quickly address your significant respiratory inflammation, please begin a low-dose of Decadron twice daily for the next 5 days, this replaces nonsteroidal anti-inflammatory pain medications such as ibuprofen and naproxen because it will reduce inflammation better.    ProAir, Ventolin, Proventil (albuterol): This inhaled medication contains a short acting beta agonist bronchodilator.  This medication relaxes the smooth muscle of the airway in the lungs.  When these muscles  are tight, breathing becomes more constricted.  The result of relaxation of the smooth muscle is increased air movement and improved work of breathing.  This is a short acting medication that can be used every 4-6 hours as needed for increased work of breathing, shortness of breath, wheezing and excessive coughing.  It comes in the form of a handheld inhaler or nebulizer solution.  I recommended that for the next 3 to 4 days, this medication is used 4 times daily on a scheduled basis then decrease to twice daily and as needed until symptoms have completely resolved which I anticipate will be several weeks.   Robitussin, Mucinex (guaifenesin): This is an expectorant.  This single symptom reliever helps break up chest congestion and loosen up thick nasal drainage making phlegm and drainage easier to cough up and to blow out from your nose.  I recommend taking 400 mg in either liquid or tablet form three times daily as needed.  I do not recommend the 12-hour extended relief version or doses higher than 400 mg per each dose as these often make some patients feel jittery or jumpy and can interfere with sleep.  I also do not recommend that you purchase guaifenesin with the ingredient " DM" which is dextromethorphan, a cough suppressant and that you plan on taking it at bedtime.  Guaifenesin 400 mg is a safe dose for people who are being treated for high blood pressure.  This medication is available over-the-counter.   Promethazine DM: Promethazine is both a nasal decongestant that  dries up mucous membranes and an antinausea medication.  Promethazine often makes most patients feel fairly sleepy.  "DM" is dextromethorphan, a single symptom reliever which is a cough suppressant found in many over-the-counter cough medications and combination cold preparations.  Please take 5 mL before bedtime to minimize your cough which will help you sleep better.  I have sent a prescription for this medication to your pharmacy because  it cannot be purchased over-the-counter.   If symptoms have not meaningfully improved in the next 5 to 7 days, please return for repeat evaluation or follow-up with your regular provider.  If symptoms have worsened in the next 3 to 5 days, please return for repeat evaluation or follow-up with your regular provider.    Thank you for visiting urgent care today.  We appreciate the opportunity to participate in your care.

## 2023-01-21 NOTE — Telephone Encounter (Signed)
Needs in person appointment please

## 2023-02-01 ENCOUNTER — Ambulatory Visit
Admission: RE | Admit: 2023-02-01 | Discharge: 2023-02-01 | Disposition: A | Discharge status: Home / Self Care | Payer: BC Managed Care – PPO | Source: Ambulatory Visit | Attending: Emergency Medicine | Admitting: Emergency Medicine

## 2023-02-01 VITALS — BP 137/81 | HR 77 | Temp 98.6°F | Resp 18

## 2023-02-01 DIAGNOSIS — H6692 Otitis media, unspecified, left ear: Secondary | ICD-10-CM

## 2023-02-01 DIAGNOSIS — J069 Acute upper respiratory infection, unspecified: Secondary | ICD-10-CM

## 2023-02-01 MED ORDER — AZITHROMYCIN 250 MG PO TABS: 250.0000 mg | ORAL_TABLET | Freq: Every day | ORAL | 0 refills | Status: DC

## 2023-02-01 NOTE — ED Provider Notes (Signed)
Darryl Jenkins    CSN: [id number] Arrival date & time: 02/01/23  1200      History   Chief Complaint Chief Complaint  Patient presents with   Cough    I came in a week ago and had the flu.  I still have a cough and want to follow up to make sure it didn't turn into pneomonia.Thanks. - Entered by patient    HPI Darryl Jenkins is a 47 y.o. male.  Patient presents with 5-day history of sinus congestion, sinus pressure, earache.  He has a persistent cough x 11 days.  No fever or shortness of breath.  No OTC medications taken today.  He recently finished Tamiflu and dexamethasone.  His symptoms improved with these medications until 5 days ago.  Patient was seen at this urgent care on 01/21/2023; diagnosed with influenza A, influenza B, acute bronchitis; treated with Tamiflu, albuterol inhaler, dexamethasone, guaifenesin, Promethazine DM.  The history is provided by the patient and medical records.    Past Medical History:  Diagnosis Date   Chest pain, unspecified    Family history of ischemic heart disease    Hemorrhoids    Other malaise and fatigue    Pneumonia     Patient Active Problem List   Diagnosis Date Noted   Colon cancer screening 05/24/2017   Essential hypertension 05/24/2017   Prediabetes 09/16/2015   Low HDL (under 40) 07/14/2010   Routine general medical examination at a health care facility 07/10/2010   GERD (gastroesophageal reflux disease) 03/27/2009    Past Surgical History:  Procedure Laterality Date   COLONOSCOPY WITH PROPOFOL N/A 02/04/2021   Procedure: COLONOSCOPY WITH PROPOFOL;  Surgeon: Midge Minium, MD;  Location: [facility] ENDOSCOPY;  Service: Endoscopy;  Laterality: N/A;       Home Medications    Prior to Admission medications   Medication Sig Start Date End Date Taking? Authorizing Provider  azithromycin (ZITHROMAX) 250 MG tablet Take 1 tablet (250 mg total) by mouth daily. Take first 2 tablets together, then 1 every day until  finished. 02/01/23  Yes Mickie Bail, NP  albuterol (VENTOLIN HFA) 108 (90 Base) MCG/ACT inhaler Inhale 2 puffs into the lungs every 6 (six) hours as needed for wheezing or shortness of breath (Cough). 01/21/23   Theadora Rama Scales, PA-C  amLODipine (NORVASC) 5 MG tablet Take 1 tablet (5 mg total) by mouth daily. 10/16/22   Tower, Audrie Gallus, MD  fluticasone (FLONASE) 50 MCG/ACT nasal spray SPRAY 2 SPRAYS INTO EACH NOSTRIL EVERY DAY 10/14/21   Tower, Audrie Gallus, MD  guaifenesin (HUMIBID E) 400 MG TABS tablet Take 1 tablet 3 times daily as needed for chest congestion and cough Patient not taking: Reported on 02/01/2023 01/21/23   Theadora Rama Scales, PA-C  Multiple Vitamin (MULTIVITAMIN) capsule Take 1 capsule by mouth 2 (two) times daily.    [provider]  Omega-3 Fatty Acids (FISH OIL) 1000 MG CAPS Take 1 capsule by mouth 2 (two) times daily.    [provider]  promethazine-dextromethorphan (PROMETHAZINE-DM) 6.25-15 MG/5ML syrup Take 5 mLs by mouth at bedtime as needed for cough. Patient not taking: Reported on 02/01/2023 01/21/23   Theadora Rama Scales, PA-C  vitamin C (ASCORBIC ACID) 500 MG tablet Take 500 mg by mouth daily.    [provider]    Family History Family History  Problem Relation Age of Onset   Breast cancer Mother    Hypertension Brother    Diabetes type II Brother  Hypertension Brother     Social History Social History   Tobacco Use   Smoking status: Never   Smokeless tobacco: Never  Substance Use Topics   Alcohol use: Yes    Alcohol/week: 0.0 standard drinks of alcohol    Comment: rare   Drug use: No     Allergies   Iohexol   Review of Systems Review of Systems  Constitutional:  Negative for chills and fever.  HENT:  Positive for congestion, ear pain and sinus pressure. Negative for sore throat.   Respiratory:  Positive for cough. Negative for shortness of breath.      Physical Exam Triage Vital Signs ED Triage  Vitals  Encounter Vitals Group     BP 02/01/23 1226 137/81     Systolic BP Percentile --      Diastolic BP Percentile --      Pulse Rate 02/01/23 1209 77     Resp 02/01/23 1209 18     Temp 02/01/23 1209 98.6 F (37 C)     Temp src --      SpO2 02/01/23 1209 97 %     Weight --      Height --      Head Circumference --      Peak Flow --      Pain Score 02/01/23 1224 3     Pain Loc --      Pain Education --      Exclude from Growth Chart --    No data found.  Updated Vital Signs BP 137/81   Pulse 77   Temp 98.6 F (37 C)   Resp 18   SpO2 97%   Visual Acuity Right Eye Distance:   Left Eye Distance:   Bilateral Distance:    Right Eye Near:   Left Eye Near:    Bilateral Near:     Physical Exam Constitutional:      General: He is not in acute distress. HENT:     Right Ear: Tympanic membrane normal.     Ears:     Comments: Left TM mildly erythematous.     Nose: Nose normal.     Mouth/Throat:     Mouth: Mucous membranes are moist.     Pharynx: Oropharynx is clear.  Cardiovascular:     Rate and Rhythm: Normal rate and regular rhythm.     Heart sounds: Normal heart sounds.  Pulmonary:     Effort: Pulmonary effort is normal. No respiratory distress.     Breath sounds: Normal breath sounds.  Skin:    General: Skin is warm and dry.  Neurological:     Mental Status: He is alert.      UC Treatments / Results  Labs (all labs ordered are listed, but only abnormal results are displayed) Labs Reviewed - No data to display  EKG   Radiology No results found.  Procedures Procedures (including critical care time)  Medications Ordered in UC Medications - No data to display  Initial Impression / Assessment and Plan / UC Course  I have reviewed the triage vital signs and the nursing notes.  Pertinent labs & imaging results that were available during my care of the patient were reviewed by me and considered in my medical decision making (see chart for  details).    Acute URI, left otitis media.  Afebrile and vital signs are stable.  Lungs are clear and O2 sat is 97% on room air.  Treating today with Zithromax.  Instructed patient to follow-up with his PCP.  Education provided on upper respiratory infection.  ED precautions given.  He agrees to plan of care.  Final Clinical Impressions(s) / UC Diagnoses   Final diagnoses:  Acute upper respiratory infection  Left otitis media, unspecified otitis media type     Discharge Instructions      Take the Zithromax as directed.  Follow up with your primary care provider.  Go to the emergency department if you have worsening symptoms.        ED Prescriptions     Medication Sig Dispense Auth. Provider   azithromycin (ZITHROMAX) 250 MG tablet Take 1 tablet (250 mg total) by mouth daily. Take first 2 tablets together, then 1 every day until finished. 6 tablet Mickie Bail, NP      PDMP not reviewed this encounter.   Mickie Bail, NP 02/01/23 1254

## 2023-02-01 NOTE — ED Triage Notes (Signed)
Patient to Urgent Care with complaints of persistent cough/ sinus pian and pressure/ left sided ear pain. Reports that his symptoms started approx 5 days ago.  Patient was diagnosed with Flu A/B 12/12.   Meds: tylenol cold and flu.

## 2023-02-01 NOTE — Discharge Instructions (Addendum)
Take the Zithromax as directed.  Follow up with your primary care provider.  Go to the emergency department if you have worsening symptoms.

## 2023-02-04 ENCOUNTER — Ambulatory Visit: Payer: BC Managed Care – PPO | Admitting: Family Medicine

## 2023-02-04 ENCOUNTER — Encounter: Payer: Self-pay | Admitting: Family Medicine

## 2023-02-04 VITALS — BP 125/79 | HR 74 | Temp 98.0°F | Ht 68.25 in | Wt 219.4 lb

## 2023-02-04 DIAGNOSIS — R058 Other specified cough: Secondary | ICD-10-CM | POA: Diagnosis not present

## 2023-02-04 MED ORDER — BENZONATATE 200 MG PO CAPS: 200.0000 mg | ORAL_CAPSULE | Freq: Three times a day (TID) | ORAL | 1 refills | Status: DC | PRN

## 2023-02-04 MED ORDER — PROMETHAZINE-DM 6.25-15 MG/5ML PO SYRP: 5.0000 mL | ORAL_SOLUTION | Freq: Three times a day (TID) | ORAL | 0 refills | Status: DC | PRN

## 2023-02-04 MED ORDER — PREDNISONE 10 MG PO TABS: ORAL_TABLET | ORAL | 0 refills | Status: DC

## 2023-02-04 NOTE — Assessment & Plan Note (Signed)
S/p influenza (both A and B) , and OM on azithro Reviewed UC records, lab results and studies in detail   Reassuring exam  Seems to have airway inflammation /some wheeze and tight cough Prescription prednisone 40 mg taper  Tessalon and prometh dm for cough/caution of sedation Rest/fluids Update if not starting to improve in a week or if worsening  Call back and Er precautions noted in detail today

## 2023-02-04 NOTE — Patient Instructions (Signed)
Drink lots of water /fluids  Get rest when you can  Take prednisone as directed for congestion and tight cough   Finish your zithromax   Tessalon and prometh dm for cough as needed   Update if not starting to improve in a week or if worsening

## 2023-02-04 NOTE — Progress Notes (Signed)
Subjective:    Patient ID: Darryl Jenkins, male    DOB: [birth year], 47 y.o.   MRN: [id number]  HPI  Wt Readings from Last 3 Encounters:  02/04/23 219 lb 6 oz (99.5 kg)  10/16/22 200 lb (90.7 kg)  03/09/22 209 lb (94.8 kg)   33.11 kg/m  Vitals:   02/04/23 1158 02/04/23 1221  BP: (!) 146/78 125/79  Pulse: 74   Temp: 98 F (36.7 C)   SpO2: 95%     Pt presents for follow up of influenza with cough and ear pain and chest congestion   Was seen in UC on 12/12 -had positive tests for both flu A and B Treatment with tamiflu  Dexamethasone Albuterol  Prometh dm Guaifen   Seen back on 12/23 with persistent cough  Treatment with azithromycin for left OM   Back to work for a week  Felt like he may have a sinus infection then left ear started to feel full and itchy and uncomfortable   Feels congestion in throat  When he cough - gets a very tight chest   Today Still coughing and tight  Very dry  Some wheezing if he coughs hard and exerts himself  Left ear still feels off /not painful  No fever / is tired   Tried to wash his car and it wiped him out    Over the counter  Tylenol cold and flu -did not help much  Guaifenesin    Going out of town to [place] tomorrow     Patient Active Problem List   Diagnosis Date Noted   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   Colon cancer screening 05/24/2017   Essential hypertension 05/24/2017   Prediabetes 09/16/2015   Low HDL (under 40) 07/14/2010   Routine general medical examination at a health care facility 07/10/2010   GERD (gastroesophageal reflux disease) 03/27/2009   Past Medical History:  Diagnosis Date   Chest pain, unspecified    Family history of ischemic heart disease    Hemorrhoids    Other malaise and fatigue    Pneumonia    Past Surgical History:  Procedure Laterality Date   COLONOSCOPY WITH PROPOFOL N/A 02/04/2021   Procedure: COLONOSCOPY WITH PROPOFOL;  Surgeon: Midge Minium, MD;  Location: [facility]  ENDOSCOPY;  Service: Endoscopy;  Laterality: N/A;   Social History   Tobacco Use   Smoking status: Never   Smokeless tobacco: Never  Substance Use Topics   Alcohol use: Yes    Alcohol/week: 0.0 standard drinks of alcohol    Comment: rare   Drug use: No   Family History  Problem Relation Age of Onset   Breast cancer Mother    Hypertension Brother    Diabetes type II Brother    Hypertension Brother    Allergies  Allergen Reactions   Iohexol      Code: RASH, Desc: pt developed rash post 100 ml omni 350 for cta coronary exam. steroid taper prescribed after exam was complete. bsw 03/21/2009., Onset Date: [id number]    Current Outpatient Medications on File Prior to Visit  Medication Sig Dispense Refill   albuterol (VENTOLIN HFA) 108 (90 Base) MCG/ACT inhaler Inhale 2 puffs into the lungs every 6 (six) hours as needed for wheezing or shortness of breath (Cough). 18 g 2   amLODipine (NORVASC) 5 MG tablet Take 1 tablet (5 mg total) by mouth daily. 90 tablet 3   azithromycin (ZITHROMAX) 250 MG tablet Take 1 tablet (250 mg total) by mouth daily.  Take first 2 tablets together, then 1 every day until finished. 6 tablet 0   fluticasone (FLONASE) 50 MCG/ACT nasal spray SPRAY 2 SPRAYS INTO EACH NOSTRIL EVERY DAY 48 mL 3   guaifenesin (HUMIBID E) 400 MG TABS tablet Take 1 tablet 3 times daily as needed for chest congestion and cough 30 tablet 0   Multiple Vitamin (MULTIVITAMIN) capsule Take 1 capsule by mouth 2 (two) times daily.     Omega-3 Fatty Acids (FISH OIL) 1000 MG CAPS Take 1 capsule by mouth 2 (two) times daily.     vitamin C (ASCORBIC ACID) 500 MG tablet Take 500 mg by mouth daily.     No current facility-administered medications on file prior to visit.    Review of Systems  Constitutional:  Negative for activity change, appetite change, fatigue, fever and unexpected weight change.  HENT:  Positive for postnasal drip and sinus pressure. Negative for congestion, rhinorrhea, sinus  pain, sore throat, trouble swallowing and voice change.        Left ear pressure Not pain   Eyes:  Negative for pain, redness, itching and visual disturbance.  Respiratory:  Positive for cough. Negative for chest tightness, shortness of breath and wheezing.   Cardiovascular:  Negative for chest pain and palpitations.  Gastrointestinal:  Negative for abdominal pain, blood in stool, constipation, diarrhea and nausea.  Endocrine: Negative for cold intolerance, heat intolerance, polydipsia and polyuria.  Genitourinary:  Negative for difficulty urinating, dysuria, frequency and urgency.  Musculoskeletal:  Negative for arthralgias, joint swelling and myalgias.  Skin:  Negative for pallor and rash.  Neurological:  Negative for dizziness, tremors, weakness, numbness and headaches.  Hematological:  Negative for adenopathy. Does not bruise/bleed easily.  Psychiatric/Behavioral:  Negative for decreased concentration and dysphoric mood. The patient is not nervous/anxious.        Objective:   Physical Exam Constitutional:      General: He is not in acute distress.    Appearance: Normal appearance. He is well-developed. He is not ill-appearing, toxic-appearing or diaphoretic.     Comments: Overwt  Muscular build   HENT:     Head: Normocephalic and atraumatic.     Comments: No sinus tenderness    Right Ear: Tympanic membrane, ear canal and external ear normal.     Left Ear: Ear canal and external ear normal.     Ears:     Comments: Left TM is more dull than R    Nose: Congestion and rhinorrhea present.     Mouth/Throat:     Mouth: Mucous membranes are moist.     Pharynx: Oropharynx is clear. No oropharyngeal exudate or posterior oropharyngeal erythema.     Comments: Clear pnd  Eyes:     General:        Right eye: No discharge.        Left eye: No discharge.     Conjunctiva/sclera: Conjunctivae normal.     Pupils: Pupils are equal, round, and reactive to light.  Cardiovascular:     Rate  and Rhythm: Normal rate.     Heart sounds: Normal heart sounds.  Pulmonary:     Effort: Pulmonary effort is normal. No respiratory distress.     Breath sounds: Normal breath sounds. No stridor. No wheezing, rhonchi or rales.     Comments: Good air exch  Scant wheeze only on prolonged exp  No rhonchi  Cough sounds dry and barky  Not hoarse  Chest:     Chest wall: No tenderness.  Musculoskeletal:     Cervical back: Normal range of motion and neck supple.  Lymphadenopathy:     Cervical: No cervical adenopathy.  Skin:    General: Skin is warm and dry.     Capillary Refill: Capillary refill takes less than 2 seconds.     Findings: No rash.  Neurological:     Mental Status: He is alert.     Cranial Nerves: No cranial nerve deficit.  Psychiatric:        Mood and Affect: Mood normal.           Assessment & Plan:   Problem List Items Addressed This Visit       Respiratory   Post-viral cough syndrome - Primary   S/p influenza (both A and B) , and OM on azithro Reviewed UC records, lab results and studies in detail   Reassuring exam  Seems to have airway inflammation /some wheeze and tight cough Prescription prednisone 40 mg taper  Tessalon and prometh dm for cough/caution of sedation Rest/fluids Update if not starting to improve in a week or if worsening  Call back and Er precautions noted in detail today

## 2023-04-16 ENCOUNTER — Ambulatory Visit: Payer: Self-pay | Admitting: Family

## 2023-04-16 ENCOUNTER — Encounter: Payer: Self-pay | Admitting: Family

## 2023-04-16 VITALS — BP 130/82 | HR 82 | Temp 97.8°F | Ht 68.25 in | Wt 222.8 lb

## 2023-04-16 DIAGNOSIS — Z20822 Contact with and (suspected) exposure to covid-19: Secondary | ICD-10-CM | POA: Diagnosis not present

## 2023-04-16 DIAGNOSIS — Z20828 Contact with and (suspected) exposure to other viral communicable diseases: Secondary | ICD-10-CM | POA: Diagnosis not present

## 2023-04-16 DIAGNOSIS — H6501 Acute serous otitis media, right ear: Secondary | ICD-10-CM

## 2023-04-16 LAB — POCT INFLUENZA A/B
Influenza A, POC: NEGATIVE
Influenza B, POC: NEGATIVE

## 2023-04-16 LAB — POC COVID19 BINAXNOW: SARS Coronavirus 2 Ag: NEGATIVE

## 2023-04-16 MED ORDER — OSELTAMIVIR PHOSPHATE 75 MG PO CAPS: 75.0000 mg | ORAL_CAPSULE | Freq: Every day | ORAL | 0 refills | Status: AC

## 2023-04-16 MED ORDER — AMOXICILLIN-POT CLAVULANATE 875-125 MG PO TABS: 1.0000 | ORAL_TABLET | Freq: Two times a day (BID) | ORAL | 0 refills | Status: DC

## 2023-04-16 NOTE — Progress Notes (Signed)
 Established Patient Office Visit  Subjective:   Patient ID: Darryl Jenkins, male    DOB: January 14, 1976  Age: 48 y.o. MRN: [id number]  CC:  Chief Complaint  Patient presents with   Acute Visit    Reports sinus congestion, body aches and severe fatigue. Has had flu contacts at work. Denies fever, sore throat.    HPI: Darryl Jenkins is a 48 y.o. male presenting on 04/16/2023 for Acute Visit (Reports sinus congestion, body aches and severe fatigue. Has had flu contacts at work. Denies fever, sore throat.)  Had recent flu exposure  Started two ago with nasal congestion, body aches and fatigue.  With a cough that is not productive, just started this am.   He has been taking tylenol cold and flu.          ROS: Negative unless specifically indicated above in HPI.   Relevant past medical history reviewed and updated as indicated.   Allergies and medications reviewed and updated.   Current Outpatient Medications:    amLODipine (NORVASC) 5 MG tablet, Take 1 tablet (5 mg total) by mouth daily., Disp: 90 tablet, Rfl: 3   amoxicillin-clavulanate (AUGMENTIN) 875-125 MG tablet, Take 1 tablet by mouth 2 (two) times daily., Disp: 20 tablet, Rfl: 0   fluticasone (FLONASE) 50 MCG/ACT nasal spray, SPRAY 2 SPRAYS INTO EACH NOSTRIL EVERY DAY, Disp: 48 mL, Rfl: 3   Multiple Vitamin (MULTIVITAMIN) capsule, Take 1 capsule by mouth 2 (two) times daily., Disp: , Rfl:    Omega-3 Fatty Acids (FISH OIL) 1000 MG CAPS, Take 1 capsule by mouth 2 (two) times daily., Disp: , Rfl:    oseltamivir (TAMIFLU) 75 MG capsule, Take 1 capsule (75 mg total) by mouth daily for 10 days., Disp: 10 capsule, Rfl: 0   vitamin C (ASCORBIC ACID) 500 MG tablet, Take 500 mg by mouth daily., Disp: , Rfl:   Allergies  Allergen Reactions   Iohexol      Code: RASH, Desc: pt developed rash post 100 ml omni 350 for cta coronary exam. steroid taper prescribed after exam was complete. bsw 03/21/2009., Onset Date: [id number]      Objective:   BP 130/82 (BP Location: Left Arm, Patient Position: Sitting, Cuff Size: Large)   Pulse 82   Temp 97.8 F (36.6 C) (Temporal)   Ht 5' 8.25" (1.734 m)   Wt 222 lb 12.8 oz (101.1 kg)   SpO2 96%   BMI 33.63 kg/m    Physical Exam Vitals reviewed.  Constitutional:      General: He is not in acute distress.    Appearance: Normal appearance. He is obese. He is not ill-appearing, toxic-appearing or diaphoretic.  HENT:     Head: Normocephalic.     Right Ear: A middle ear effusion is present. Tympanic membrane is erythematous.     Left Ear: Tympanic membrane normal.     Nose: Nose normal.     Right Sinus: No maxillary sinus tenderness or frontal sinus tenderness.     Left Sinus: No maxillary sinus tenderness or frontal sinus tenderness.     Mouth/Throat:     Mouth: Mucous membranes are moist.     Pharynx: Posterior oropharyngeal erythema present.     Tonsils: No tonsillar exudate.  Eyes:     Pupils: Pupils are equal, round, and reactive to light.  Cardiovascular:     Rate and Rhythm: Normal rate and regular rhythm.  Pulmonary:     Effort: Pulmonary effort is normal.  Breath sounds: Normal breath sounds. No wheezing.  Musculoskeletal:        General: Normal range of motion.     Cervical back: Normal range of motion.  Neurological:     General: No focal deficit present.     Mental Status: He is alert and oriented to person, place, and time. Mental status is at baseline.  Psychiatric:        Mood and Affect: Mood normal.        Behavior: Behavior normal.        Thought Content: Thought content normal.        Judgment: Judgment normal.     Assessment & Plan:  Exposure to influenza -     POCT Influenza A/B -     Oseltamivir Phosphate; Take 1 capsule (75 mg total) by mouth daily for 10 days.  Dispense: 10 capsule; Refill: 0  Suspected COVID-19 virus infection -     POC COVID-19 BinaxNow  Non-recurrent acute serous otitis media of right ear Assessment &  Plan: Take antibiotic as prescribed. Increase oral fluids. Pt to f/u if sx worsen and or fail to improve in 2-3 days. rx augmentin 875/125 mg po bid x 10 days  Exposure to flu, flu and covid neg. Will still give tamiflu 75 mg every day x 10 days   Orders: -     Amoxicillin-Pot Clavulanate; Take 1 tablet by mouth 2 (two) times daily.  Dispense: 20 tablet; Refill: 0     Follow up plan: Return for f/u PCP if no improvement in symptoms.  Mort Sawyers, FNP

## 2023-04-16 NOTE — Assessment & Plan Note (Signed)
 Take antibiotic as prescribed. Increase oral fluids. Pt to f/u if sx worsen and or fail to improve in 2-3 days. rx augmentin 875/125 mg po bid x 10 days  Exposure to flu, flu and covid neg. Will still give tamiflu 75 mg every day x 10 days

## 2023-05-10 ENCOUNTER — Encounter: Payer: Self-pay | Admitting: Family Medicine

## 2023-08-31 ENCOUNTER — Encounter: Payer: Self-pay | Admitting: Family Medicine

## 2023-08-31 ENCOUNTER — Ambulatory Visit: Payer: Self-pay

## 2023-08-31 NOTE — Telephone Encounter (Signed)
 See triage note also

## 2023-08-31 NOTE — Telephone Encounter (Signed)
 Will see patient then Agree with ER and UC precautions

## 2023-08-31 NOTE — Telephone Encounter (Signed)
 FYI Only or Action Required?: FYI only for provider.  Patient was last seen in primary care on 04/16/2023 by Corwin Antu, FNP.  Called Nurse Triage reporting Head Injury.  Symptoms began several days ago.  Interventions attempted: OTC medications: Tylenol, Rest, hydration, or home remedies, and Ice/heat application.  Symptoms are: upper neck pain, headache, dizziness unchanged.  Triage Disposition: See PCP When Office is Open (Within 3 Days)  Patient/caregiver understands and will follow disposition?: Yes             Copied from CRM [id number]. Topic: Clinical - Red Word Triage >> Aug 31, 2023  8:09 AM Suzen RAMAN wrote: Red Word that prompted transfer to Nurse Triage: hit head on a tree leam sunday and thinks he may have a concussion. Reason for Disposition  [1] After 3 days AND [2] headache persists  Answer Assessment - Initial Assessment Questions Patient states he had a concussion in the past and this morning he states he felt similar symptoms (unable to move his eyes back and forth).  1. MECHANISM: How did the injury happen? For falls, ask: What height did you fall from? and What surface did you fall against?      Patient states he was mowing and ducked his head below a branch and did not see an additional branch. He states he hit his head on the branch and fell down.  2. ONSET: When did the injury happen? (e.g., minutes, hours ago)      Sunday around noon.  3. NEUROLOGIC SYMPTOMS: Was there any loss of consciousness? Are there any other neurological symptoms?      No LOC. No numbness, weakness, tingling.  4. MENTAL STATUS: Does the person know who they are, who you are, and where they are?      Alert and oriented x name, DOB, location, date.  5. LOCATION: What part of the head was hit?      Top, just above forehead.  6. SCALP APPEARANCE: What does the scalp look like? Is it bleeding now? If Yes, ask: Is it difficult to stop?      No bleeding  or lacerations.  7. SIZE: For cuts, bruises, or swelling, ask: How large is it? (e.g., inches or centimeters)      States it looks like a bruise starting, he denies any swelling.  8. PAIN: Is there any pain? If Yes, ask: How bad is it? (Scale 0-10; or none, mild, moderate, severe)     2-3/10. Upper back of neck (right); headache. Treated with Tylenol.  9. TETANUS: For any breaks in the skin, ask: When was your last tetanus booster?     20 21 per chart.  10. BLOOD THINNERS: Do you take any blood thinners? (e.g., aspirin, clopidogrel / Plavix, coumadin, heparin). Notes: Other strong blood thinners include: Arixtra (fondaparinux), Eliquis (apixaban), Pradaxa (dabigatran), and Xarelto (rivaroxaban).       No.  11. OTHER SYMPTOMS: Do you have any other symptoms? (e.g., neck pain, vomiting)       Patient denies nausea, vomiting, vision loss or double vision. He states this morning he did notice slight dizziness.  12. PREGNANCY: Is there any chance you are pregnant? When was your last menstrual period?       N/A.  Protocols used: Head Injury-A-AH

## 2023-09-01 ENCOUNTER — Ambulatory Visit: Admitting: Family Medicine

## 2023-09-01 ENCOUNTER — Encounter: Payer: Self-pay | Admitting: Family Medicine

## 2023-09-01 VITALS — BP 136/84 | HR 72 | Temp 98.4°F | Ht 68.25 in | Wt 214.2 lb

## 2023-09-01 DIAGNOSIS — M542 Cervicalgia: Secondary | ICD-10-CM | POA: Diagnosis not present

## 2023-09-01 DIAGNOSIS — S0990XA Unspecified injury of head, initial encounter: Secondary | ICD-10-CM | POA: Insufficient documentation

## 2023-09-01 NOTE — Assessment & Plan Note (Signed)
 This occurred after standing and hitting head on tree branch 4 d ago  Improved now  Some muscular spasm /soreness No bony tenderness and normal rom  Discussed use of heat prn  If worse consider imaging

## 2023-09-01 NOTE — Patient Instructions (Signed)
 Continue to rest your brain when able from screens/reading/ chaos  Get physical rest   Take breaks if you have trouble concentrating Tylenol is ok if needed   Watch for increase in  Headache Dizziness Concentration problems  Watch for new symptoms of any type including nausea /vomiting or personality change   If neck pain worsens let us  know (or does not continue to improved)  Ice is good for the head  Heat for the neck to relax muscles

## 2023-09-01 NOTE — Assessment & Plan Note (Addendum)
 4 days ago , no LOC Some mild concussive symptoms - improving with brain rest  Reassuring exam and interview today   Discussed signs and symptoms of concussion in detail  Red flags reviewed- see AVS  If any worsening of symptoms or failure to improve more would consider CT scan  Handout given Will continue brain rest-out of work until Monday  Update if not starting to improve in a week or if worsening  Call back and Er precautions noted in detail today    30 minutes were spent today both face to face and in the chart obtaining history, reviewing records (last concussion was 2019) , performing exam , educating and discussing treatment options/plan

## 2023-09-01 NOTE — Progress Notes (Signed)
 Subjective:    Patient ID: Darryl Jenkins, male    DOB: [birth year], 48 y.o.   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  Got tired also , and left work home to rest (slept for 3 hours)   This am felt like he was drifting when walking  Just a little unsteady   Pressure over temples without pain today  Scalp is sensitive but not painful /improved  Neck is tight - not painful  Focus is a little off /zones out easily    Trying to minimize screens today     Tetanus shot 2021    No nausea No spinning  No vomiting      Last concussion was 2-3 years ago    Patient Active Problem List   Diagnosis Date Noted   Closed head injury 09/01/2023   Neck pain 09/01/2023   Essential hypertension 05/24/2017   Prediabetes 09/16/2015   Low HDL (under 40) 07/14/2010   GERD (gastroesophageal reflux disease) 03/27/2009   Past Medical History:  Diagnosis Date   Chest pain, unspecified    Family history of ischemic heart disease    Hemorrhoids    Other malaise and fatigue    Pneumonia    Past Surgical History:  Procedure Laterality Date   COLONOSCOPY WITH PROPOFOL  N/A 02/04/2021   Procedure: COLONOSCOPY WITH PROPOFOL ;  Surgeon: Jinny Carmine, MD;  Location: [facility] ENDOSCOPY;  Service: Endoscopy;  Laterality: N/A;   Social History   Tobacco Use   Smoking status: Never   Smokeless tobacco: Never  Substance Use Topics   Alcohol use: Yes     Alcohol/week: 0.0 standard drinks of alcohol    Comment: rare   Drug use: No   Family History  Problem Relation Age of Onset   Breast cancer Mother    Hypertension Brother    Diabetes type II Brother    Hypertension Brother    Allergies  Allergen Reactions   Iohexol      Code: RASH, Desc: pt developed rash post 100 ml omni 350 for cta coronary exam. steroid taper prescribed after exam was complete. bsw 03/21/2009., Onset Date: [id number]    Current Outpatient Medications on File Prior to Visit  Medication Sig Dispense Refill   amLODipine  (NORVASC ) 5 MG tablet Take 1 tablet (5 mg total) by mouth daily. 90 tablet 3   fluticasone  (FLONASE ) 50 MCG/ACT nasal spray SPRAY 2 SPRAYS INTO EACH NOSTRIL EVERY DAY 48 mL 3   Multiple Vitamin (MULTIVITAMIN) capsule Take 1 capsule by mouth 2 (two) times daily.     Omega-3 Fatty Acids (FISH OIL) 1000 MG CAPS Take 1 capsule by mouth 2 (two) times daily.     vitamin C (ASCORBIC ACID) 500 MG tablet Take 500 mg by mouth daily.  No current facility-administered medications on file prior to visit.    Review of Systems  Constitutional:  Negative for activity change, appetite change, fatigue, fever and unexpected weight change.  HENT:  Negative for congestion, rhinorrhea, sore throat and trouble swallowing.   Eyes:  Negative for pain, redness, itching and visual disturbance.  Respiratory:  Negative for cough, chest tightness, shortness of breath and wheezing.   Cardiovascular:  Negative for chest pain and palpitations.  Gastrointestinal:  Negative for abdominal pain, blood in stool, constipation, diarrhea and nausea.  Endocrine: Negative for cold intolerance, heat intolerance, polydipsia and polyuria.  Genitourinary:  Negative for difficulty urinating, dysuria, frequency and urgency.  Musculoskeletal:  Positive for neck pain. Negative for arthralgias, joint swelling, myalgias and neck stiffness.       Neck soreness is improved   Skin:  Negative for  pallor and rash.  Neurological:  Positive for dizziness and headaches. Negative for tremors, seizures, syncope, facial asymmetry, speech difficulty, weakness, light-headedness and numbness.       Occational feels like he is swaying  This is improved Headache now a pressure sensation   Hematological:  Negative for adenopathy. Does not bruise/bleed easily.  Psychiatric/Behavioral:  Negative for decreased concentration and dysphoric mood. The patient is not nervous/anxious.        Objective:   Physical Exam Constitutional:      General: He is not in acute distress.    Appearance: Normal appearance. He is well-developed and normal weight. He is not ill-appearing or diaphoretic.  HENT:     Head: Normocephalic and atraumatic.     Comments: No bruising of face or scalp  Small abrasion on mid anterior scalp with scab    Right Ear: Tympanic membrane, ear canal and external ear normal.     Left Ear: Tympanic membrane, ear canal and external ear normal.     Ears:     Comments: No hemotympanum      Nose: Nose normal.     Mouth/Throat:     Mouth: Mucous membranes are moist.     Pharynx: No oropharyngeal exudate.  Eyes:     General: No scleral icterus.       Right eye: No discharge.        Left eye: No discharge.     Conjunctiva/sclera: Conjunctivae normal.     Pupils: Pupils are equal, round, and reactive to light.     Comments: No nystagmus  Neck:     Thyroid : No thyromegaly.     Vascular: No carotid bruit or JVD.     Trachea: No tracheal deviation.     Comments: No bony tenderness Cervical musculature is tight  Cardiovascular:     Rate and Rhythm: Normal rate and regular rhythm.     Heart sounds: Normal heart sounds. No murmur heard. Pulmonary:     Effort: Pulmonary effort is normal. No respiratory distress.     Breath sounds: Normal breath sounds. No wheezing or rales.  Abdominal:     General: Bowel sounds are normal. There is no distension.     Palpations: Abdomen is soft.  There is no mass.     Tenderness: There is no abdominal tenderness.  Musculoskeletal:        General: No tenderness.     Cervical back: Full passive range of motion without pain, normal range of motion and neck supple. No rigidity.  Lymphadenopathy:     Cervical: No cervical adenopathy.  Skin:    General: Skin is warm and  dry.     Coloration: Skin is not pale.     Findings: No bruising or rash.  Neurological:     Mental Status: He is alert and oriented to person, place, and time.     Cranial Nerves: Cranial nerves 2-12 are intact. No cranial nerve deficit, dysarthria or facial asymmetry.     Sensory: Sensation is intact. No sensory deficit.     Motor: Motor function is intact. No weakness, tremor, atrophy, abnormal muscle tone, seizure activity or pronator drift.     Coordination: Coordination is intact. Romberg sign negative. Coordination normal. Finger-Nose-Finger Test normal.     Gait: Gait is intact. Gait and tandem walk normal.     Deep Tendon Reflexes: Reflexes are normal and symmetric.     Comments: No focal cerebellar signs   Psychiatric:        Behavior: Behavior normal.        Thought Content: Thought content normal.           Assessment & Plan:   Problem List Items Addressed This Visit       Other   Neck pain   This occurred after standing and hitting head on tree branch 4 d ago  Improved now  Some muscular spasm /soreness No bony tenderness and normal rom  Discussed use of heat prn  If worse consider imaging       Closed head injury - Primary   4 days ago , no LOC Some mild concussive symptoms - improving with brain rest  Reassuring exam and interview today   Discussed signs and symptoms of concussion in detail  Red flags reviewed- see AVS  If any worsening of symptoms or failure to improve more would consider CT scan  Handout given Will continue brain rest-out of work until Monday  Update if not starting to improve in a week or if worsening  Call  back and Er precautions noted in detail today

## 2023-10-11 ENCOUNTER — Telehealth: Payer: Self-pay | Admitting: Family Medicine

## 2023-10-11 DIAGNOSIS — R7303 Prediabetes: Secondary | ICD-10-CM

## 2023-10-11 DIAGNOSIS — I10 Essential (primary) hypertension: Secondary | ICD-10-CM

## 2023-10-11 DIAGNOSIS — E786 Lipoprotein deficiency: Secondary | ICD-10-CM

## 2023-10-11 NOTE — Telephone Encounter (Signed)
-----   Message from Veva JINNY Ferrari sent at 09/28/2023  2:45 PM EDT ----- Regarding: Lab orders for Tue, 8.2.25 Patient is scheduled for CPX labs, please order future labs, Thanks , Veva

## 2023-10-12 ENCOUNTER — Other Ambulatory Visit (INDEPENDENT_AMBULATORY_CARE_PROVIDER_SITE_OTHER): Payer: BC Managed Care – PPO

## 2023-10-12 ENCOUNTER — Ambulatory Visit: Payer: Self-pay | Admitting: Family Medicine

## 2023-10-12 DIAGNOSIS — R7303 Prediabetes: Secondary | ICD-10-CM | POA: Diagnosis not present

## 2023-10-12 DIAGNOSIS — E786 Lipoprotein deficiency: Secondary | ICD-10-CM

## 2023-10-12 DIAGNOSIS — I10 Essential (primary) hypertension: Secondary | ICD-10-CM

## 2023-10-12 LAB — COMPREHENSIVE METABOLIC PANEL WITH GFR
ALT: 21 U/L (ref 0–53)
AST: 21 U/L (ref 0–37)
Albumin: 4.6 g/dL (ref 3.5–5.2)
Alkaline Phosphatase: 34 U/L — ABNORMAL LOW (ref 39–117)
BUN: 12 mg/dL (ref 6–23)
CO2: 26 meq/L (ref 19–32)
Calcium: 9.4 mg/dL (ref 8.4–10.5)
Chloride: 103 meq/L (ref 96–112)
Creatinine, Ser: 0.9 mg/dL (ref 0.40–1.50)
GFR: 100.48 mL/min (ref >60.00)
Glucose, Bld: 109 mg/dL — ABNORMAL HIGH (ref 70–99)
Potassium: 4.8 meq/L (ref 3.5–5.1)
Sodium: 138 meq/L (ref 135–145)
Total Bilirubin: 1.4 mg/dL — ABNORMAL HIGH (ref 0.2–1.2)
Total Protein: 6.9 g/dL (ref 6.0–8.3)

## 2023-10-12 LAB — CBC WITH DIFFERENTIAL/PLATELET
Basophils Absolute: 0 K/uL (ref 0.0–0.1)
Basophils Relative: 0.9 % (ref 0.0–3.0)
Eosinophils Absolute: 0.1 K/uL (ref 0.0–0.7)
Eosinophils Relative: 2.3 % (ref 0.0–5.0)
HCT: 43.5 % (ref 39.0–52.0)
Hemoglobin: 14.9 g/dL (ref 13.0–17.0)
Lymphocytes Relative: 31.9 % (ref 12.0–46.0)
Lymphs Abs: 1.6 K/uL (ref 0.7–4.0)
MCHC: 34.3 g/dL (ref 30.0–36.0)
MCV: 88.7 fl (ref 78.0–100.0)
Monocytes Absolute: 0.4 K/uL (ref 0.1–1.0)
Monocytes Relative: 7.6 % (ref 3.0–12.0)
Neutro Abs: 2.8 K/uL (ref 1.4–7.7)
Neutrophils Relative %: 57.3 % (ref 43.0–77.0)
Platelets: 307 K/uL (ref 150.0–400.0)
RBC: 4.91 Mil/uL (ref 4.22–5.81)
RDW: 12.9 % (ref 11.5–15.5)
WBC: 5 K/uL (ref 4.0–10.5)

## 2023-10-12 LAB — LIPID PANEL
Cholesterol: 171 mg/dL (ref 0–200)
HDL: 34 mg/dL — ABNORMAL LOW (ref >39.00)
LDL Cholesterol: 101 mg/dL — ABNORMAL HIGH (ref 0–99)
NonHDL: 137.46
Total CHOL/HDL Ratio: 5
Triglycerides: 180 mg/dL — ABNORMAL HIGH (ref 0.0–149.0)
VLDL: 36 mg/dL (ref 0.0–40.0)

## 2023-10-12 LAB — HEMOGLOBIN A1C: Hgb A1c MFr Bld: 6.1 % (ref 4.6–6.5)

## 2023-10-12 LAB — TSH: TSH: 3.17 u[IU]/mL (ref 0.35–5.50)

## 2023-10-18 ENCOUNTER — Ambulatory Visit (INDEPENDENT_AMBULATORY_CARE_PROVIDER_SITE_OTHER): Payer: BC Managed Care – PPO | Admitting: Family Medicine

## 2023-10-18 ENCOUNTER — Encounter: Payer: Self-pay | Admitting: Family Medicine

## 2023-10-18 VITALS — BP 121/80 | HR 63 | Temp 98.2°F | Ht 68.25 in | Wt 212.4 lb

## 2023-10-18 DIAGNOSIS — I10 Essential (primary) hypertension: Secondary | ICD-10-CM

## 2023-10-18 DIAGNOSIS — R7303 Prediabetes: Secondary | ICD-10-CM

## 2023-10-18 DIAGNOSIS — E786 Lipoprotein deficiency: Secondary | ICD-10-CM

## 2023-10-18 DIAGNOSIS — Z0000 Encounter for general adult medical examination without abnormal findings: Secondary | ICD-10-CM

## 2023-10-18 MED ORDER — AMLODIPINE BESYLATE 5 MG PO TABS: 5.0000 mg | ORAL_TABLET | Freq: Every day | ORAL | 3 refills | Status: AC

## 2023-10-18 NOTE — Assessment & Plan Note (Signed)
 bp in fair control at this time  BP Readings from Last 1 Encounters:  10/18/23 121/80   No changes needed Most recent labs reviewed  Disc lifstyle change with low sodium diet and exercise  Plan to continue amlodipine  5 mg daily

## 2023-10-18 NOTE — Assessment & Plan Note (Signed)
 Prediabetes  Lab Results  Component Value Date   HGBA1C 6.1 10/12/2023   HGBA1C 5.9 10/09/2022   HGBA1C 6.1 10/07/2021    disc imp of low glycemic diet and wt loss to prevent DM2  DM in brother  Will work on this

## 2023-10-18 NOTE — Assessment & Plan Note (Addendum)
 Reviewed health habits including diet and exercise and skin cancer prevention Reviewed appropriate screening tests for age  Also reviewed health mt list, fam hx and immunization status , as well as social and family history   See HPI Labs reviewed and ordered Health Maintenance  Topic Date Due   HIV Screening  Never done   Hepatitis C Screening  Never done   Hepatitis B Vaccine (1 of 3 - 19+ 3-dose series) Never done   Flu Shot  05/09/2024*   COVID-19 Vaccine (1 - 2024-25 season) 11/02/2024*   DTaP/Tdap/Td vaccine (4 - Tdap) 10/08/2029   Colon Cancer Screening  02/05/2031   Pneumococcal Vaccine  Aged Out   HPV Vaccine  Aged Out   Meningitis B Vaccine  Aged Out  *Topic was postponed. The date shown is not the original due date.   Plans flu shot later this season  Discussed checking psa at 50  No prostate symptoms Discussed fall prevention, supplements and exercise for bone density   PHQ 0 Labs reviewed

## 2023-10-18 NOTE — Assessment & Plan Note (Signed)
 Disc goals for lipids and reasons to control them Rev last labs with pt Rev low sat fat diet in detail LLD down to 101  Trig up to 180 - may be from dietary sugar Will soek on this

## 2023-10-18 NOTE — Patient Instructions (Addendum)
 Get your flu shot later in the season   To prevent diabetes  Avoid added sugars  Try to get most of your carbohydrates from produce (with the exception of white potatoes) and whole grains Eat less bread/pasta/rice/snack foods/cereals/sweets and other items from the middle of the grocery store (processed carbs)   Keep exercising  Strength training is most important   Take care of yourself

## 2023-10-18 NOTE — Progress Notes (Signed)
 Subjective:    Patient ID: Darryl Jenkins, male    DOB: 10-03-1975, 48 y.o.   MRN: [id number]  HPI  Here for health maintenance exam and to review chronic medical problems   Wt Readings from Last 3 Encounters:  10/18/23 212 lb 6 oz (96.3 kg)  09/01/23 214 lb 4 oz (97.2 kg)  04/16/23 222 lb 12.8 oz (101.1 kg)   32.06 kg/m  Vitals:   10/18/23 1454 10/18/23 1519  BP: 136/84 121/80  Pulse: 63   Temp: 98.2 F (36.8 C)   SpO2: 98%     Immunization History  Administered Date(s) Administered   Influenza Whole 12/10/2001   Influenza,inj,Quad PF,6+ Mos 12/06/2017, 10/07/2018   Influenza-Unspecified 12/05/2012, 12/09/2014   PPD Test 09/07/2017   Pneumococcal Polysaccharide-23 02/09/2002, 04/17/2009   Td 02/09/1998, 04/17/2009, 10/09/2019    Health Maintenance Due  Topic Date Due   HIV Screening  Never done   Hepatitis C Screening  Never done   Hepatitis B Vaccines 19-59 Average Risk (1 of 3 - 19+ 3-dose series) Never done   Declines flu shot- will get later in season    Colon cancer screening  Colonoscopy 2022   Bone health   No prostate cancer in family  No prostate symptoms    Falls-none new  Fractures- none  Supplements -mvi for 50 plus   Exercise  Goes to the gym  Less time to go with new job  Starting to make time with it    Mood    10/18/2023    2:57 PM 09/01/2023    3:59 PM 02/04/2023   12:05 PM 10/16/2022    8:32 AM 03/09/2022    3:06 PM  Depression screen PHQ 2/9  Decreased Interest 0 0 0 0 0  Down, Depressed, Hopeless 0 0 0 0 0  PHQ - 2 Score 0 0 0 0 0  Altered sleeping 0 0 0 0 0  Tired, decreased energy 0 0 0 0 0  Change in appetite 0 0 0 0 0  Feeling bad or failure about yourself  0 0 0 0 0  Trouble concentrating 0 0 0 0 0  Moving slowly or fidgety/restless 0 0 0 0 0  Suicidal thoughts 0 0 0 0 0  PHQ-9 Score 0 0 0 0 0  Difficult doing work/chores Not difficult at all Not difficult at all Not difficult at all Not difficult at all Not  difficult at all      10/18/2023    2:57 PM 09/01/2023    3:59 PM 02/04/2023   12:05 PM 10/16/2022    8:32 AM  GAD 7 : Generalized Anxiety Score  Nervous, Anxious, on Edge 1 0 0 0  Control/stop worrying 1 0 0 0  Worry too much - different things 1 0 0 0  Trouble relaxing 1 0 0 0  Restless 0 0 0 0  Easily annoyed or irritable 0 0 0 0  Afraid - awful might happen 0 0 0 0  Total GAD 7 Score 4 0 0 0  Anxiety Difficulty Not difficult at all Not difficult at all Not difficult at all Not difficult at all    More stress at new job Hard but he likes it   HTN bp is stable today  No cp or palpitations or headaches or edema  No side effects to medicines  BP Readings from Last 3 Encounters:  10/18/23 121/80  09/01/23 136/84  04/16/23 130/82    Amlodipine  5 mg  daily   Lab Results  Component Value Date   NA 138 10/12/2023   K 4.8 10/12/2023   CO2 26 10/12/2023   GLUCOSE 109 (H) 10/12/2023   BUN 12 10/12/2023   CREATININE 0.90 10/12/2023   CALCIUM 9.4 10/12/2023   GFR 100.48 10/12/2023   GFRNONAA 102.29 02/27/2009    Cholesterol Lab Results  Component Value Date   CHOL 171 10/12/2023   CHOL 157 10/09/2022   CHOL 159 10/07/2021   Lab Results  Component Value Date   HDL 34.00 (L) 10/12/2023   HDL 32.20 (L) 10/09/2022   HDL 33.30 (L) 10/07/2021   Lab Results  Component Value Date   LDLCALC 101 (H) 10/12/2023   LDLCALC 106 (H) 10/09/2022   LDLCALC 104 (H) 10/07/2021   Lab Results  Component Value Date   TRIG 180.0 (H) 10/12/2023   TRIG 93.0 10/09/2022   TRIG 112.0 10/07/2021   Lab Results  Component Value Date   CHOLHDL 5 10/12/2023   CHOLHDL 5 10/09/2022   CHOLHDL 5 10/07/2021   No results found for: LDLDIRECT  Diet not as good    Prediabetes Lab Results  Component Value Date   HGBA1C 6.1 10/12/2023   HGBA1C 5.9 10/09/2022   HGBA1C 6.1 10/07/2021   Brother with diabetes   Lab Results  Component Value Date   ALT 21 10/12/2023   AST 21 10/12/2023    ALKPHOS 34 (L) 10/12/2023   BILITOT 1.4 (H) 10/12/2023   Lab Results  Component Value Date   TSH 3.17 10/12/2023   Lab Results  Component Value Date   WBC 5.0 10/12/2023   HGB 14.9 10/12/2023   HCT 43.5 10/12/2023   MCV 88.7 10/12/2023   PLT 307.0 10/12/2023      Patient Active Problem List   Diagnosis Date Noted   Essential hypertension 05/24/2017   Prediabetes 09/16/2015   Low HDL (under 40) 07/14/2010   Routine general medical examination at a health care facility 07/10/2010   GERD (gastroesophageal reflux disease) 03/27/2009   Past Medical History:  Diagnosis Date   Chest pain, unspecified    Family history of ischemic heart disease    Hemorrhoids    Other malaise and fatigue    Pneumonia    Past Surgical History:  Procedure Laterality Date   COLONOSCOPY WITH PROPOFOL  N/A 02/04/2021   Procedure: COLONOSCOPY WITH PROPOFOL ;  Surgeon: Jinny Carmine, MD;  Location: [facility] ENDOSCOPY;  Service: Endoscopy;  Laterality: N/A;   Social History   Tobacco Use   Smoking status: Never   Smokeless tobacco: Never  Substance Use Topics   Alcohol use: Yes    Alcohol/week: 0.0 standard drinks of alcohol    Comment: rare   Drug use: No   Family History  Problem Relation Age of Onset   Breast cancer Mother    Hypertension Brother    Diabetes type II Brother    Hypertension Brother    Allergies  Allergen Reactions   Iohexol      Code: RASH, Desc: pt developed rash post 100 ml omni 350 for cta coronary exam. steroid taper prescribed after exam was complete. bsw 03/21/2009., Onset Date: [id number]    Current Outpatient Medications on File Prior to Visit  Medication Sig Dispense Refill   fluticasone  (FLONASE ) 50 MCG/ACT nasal spray SPRAY 2 SPRAYS INTO EACH NOSTRIL EVERY DAY 48 mL 3   Multiple Vitamin (MULTIVITAMIN) capsule Take 1 capsule by mouth 2 (two) times daily.     Omega-3 Fatty Acids (FISH  OIL) 1000 MG CAPS Take 1 capsule by mouth 2 (two) times daily.     vitamin C  (ASCORBIC ACID) 500 MG tablet Take 500 mg by mouth daily.     No current facility-administered medications on file prior to visit.    Review of Systems  Constitutional:  Negative for activity change, appetite change, fatigue, fever and unexpected weight change.  HENT:  Negative for congestion, rhinorrhea, sore throat and trouble swallowing.   Eyes:  Negative for pain, redness, itching and visual disturbance.  Respiratory:  Negative for cough, chest tightness, shortness of breath and wheezing.   Cardiovascular:  Negative for chest pain and palpitations.  Gastrointestinal:  Negative for abdominal pain, blood in stool, constipation, diarrhea and nausea.  Endocrine: Negative for cold intolerance, heat intolerance, polydipsia and polyuria.  Genitourinary:  Negative for difficulty urinating, dysuria, frequency and urgency.  Musculoskeletal:  Negative for arthralgias, joint swelling and myalgias.  Skin:  Negative for pallor and rash.  Neurological:  Negative for dizziness, tremors, weakness, numbness and headaches.  Hematological:  Negative for adenopathy. Does not bruise/bleed easily.  Psychiatric/Behavioral:  Negative for decreased concentration and dysphoric mood. The patient is not nervous/anxious.        Objective:   Physical Exam Constitutional:      General: He is not in acute distress.    Appearance: Normal appearance. He is well-developed. He is not ill-appearing or diaphoretic.     Comments: Overweight  Muscular frame   HENT:     Head: Normocephalic and atraumatic.     Right Ear: Tympanic membrane, ear canal and external ear normal.     Left Ear: Tympanic membrane, ear canal and external ear normal.     Nose: Nose normal. No congestion.     Mouth/Throat:     Mouth: Mucous membranes are moist.     Pharynx: Oropharynx is clear. No posterior oropharyngeal erythema.  Eyes:     General: No scleral icterus.       Right eye: No discharge.        Left eye: No discharge.      Conjunctiva/sclera: Conjunctivae normal.     Pupils: Pupils are equal, round, and reactive to light.  Neck:     Thyroid : No thyromegaly.     Vascular: No carotid bruit or JVD.  Cardiovascular:     Rate and Rhythm: Normal rate and regular rhythm.     Pulses: Normal pulses.     Heart sounds: Normal heart sounds.     No gallop.  Pulmonary:     Effort: Pulmonary effort is normal. No respiratory distress.     Breath sounds: Normal breath sounds. No wheezing or rales.     Comments: Good air exch Chest:     Chest wall: No tenderness.  Abdominal:     General: Bowel sounds are normal. There is no distension or abdominal bruit.     Palpations: Abdomen is soft. There is no mass.     Tenderness: There is no abdominal tenderness.     Hernia: No hernia is present.  Musculoskeletal:        General: No tenderness.     Cervical back: Normal range of motion and neck supple. No rigidity. No muscular tenderness.     Right lower leg: No edema.     Left lower leg: No edema.  Lymphadenopathy:     Cervical: No cervical adenopathy.  Skin:    General: Skin is warm and dry.     Coloration: Skin  is not pale.     Findings: No erythema or rash.     Comments: Solar lentigines diffusely   Neurological:     Mental Status: He is alert.     Cranial Nerves: No cranial nerve deficit.     Motor: No abnormal muscle tone.     Coordination: Coordination normal.     Gait: Gait normal.     Deep Tendon Reflexes: Reflexes are normal and symmetric. Reflexes normal.  Psychiatric:        Mood and Affect: Mood normal.        Cognition and Memory: Cognition and memory normal.           Assessment & Plan:   Problem List Items Addressed This Visit       Cardiovascular and Mediastinum   Essential hypertension   bp in fair control at this time  BP Readings from Last 1 Encounters:  10/18/23 121/80   No changes needed Most recent labs reviewed  Disc lifstyle change with low sodium diet and exercise  Plan to  continue amlodipine  5 mg daily        Relevant Medications   amLODipine  (NORVASC ) 5 MG tablet     Other   Routine general medical examination at a health care facility - Primary   Reviewed health habits including diet and exercise and skin cancer prevention Reviewed appropriate screening tests for age  Also reviewed health mt list, fam hx and immunization status , as well as social and family history   See HPI Labs reviewed and ordered Health Maintenance  Topic Date Due   HIV Screening  Never done   Hepatitis C Screening  Never done   Hepatitis B Vaccine (1 of 3 - 19+ 3-dose series) Never done   Flu Shot  05/09/2024*   COVID-19 Vaccine (1 - 2024-25 season) 11/02/2024*   DTaP/Tdap/Td vaccine (4 - Tdap) 10/08/2029   Colon Cancer Screening  02/05/2031   Pneumococcal Vaccine  Aged Out   HPV Vaccine  Aged Out   Meningitis B Vaccine  Aged Out  *Topic was postponed. The date shown is not the original due date.   Plans flu shot later this season  Discussed checking psa at 50  No prostate symptoms Discussed fall prevention, supplements and exercise for bone density   PHQ 0 Labs reviewed       Prediabetes   Prediabetes  Lab Results  Component Value Date   HGBA1C 6.1 10/12/2023   HGBA1C 5.9 10/09/2022   HGBA1C 6.1 10/07/2021    disc imp of low glycemic diet and wt loss to prevent DM2  DM in brother  Will work on this       Low HDL (under 40)   Disc goals for lipids and reasons to control them Rev last labs with pt Rev low sat fat diet in detail LLD down to 101  Trig up to 180 - may be from dietary sugar Will soek on this

## 2023-11-26 ENCOUNTER — Telehealth: Admitting: Family Medicine

## 2023-11-26 ENCOUNTER — Encounter: Payer: Self-pay | Admitting: Family Medicine

## 2023-11-26 VITALS — Temp 98.0°F | Wt 213.0 lb

## 2023-11-26 DIAGNOSIS — J209 Acute bronchitis, unspecified: Secondary | ICD-10-CM

## 2023-11-26 DIAGNOSIS — J019 Acute sinusitis, unspecified: Secondary | ICD-10-CM | POA: Insufficient documentation

## 2023-11-26 DIAGNOSIS — J0100 Acute maxillary sinusitis, unspecified: Secondary | ICD-10-CM | POA: Diagnosis not present

## 2023-11-26 MED ORDER — BENZONATATE 200 MG PO CAPS: 200.0000 mg | ORAL_CAPSULE | Freq: Three times a day (TID) | ORAL | 1 refills | Status: AC | PRN

## 2023-11-26 MED ORDER — PREDNISONE 10 MG PO TABS: ORAL_TABLET | ORAL | 0 refills | Status: AC

## 2023-11-26 MED ORDER — AMOXICILLIN-POT CLAVULANATE 875-125 MG PO TABS: 1.0000 | ORAL_TABLET | Freq: Two times a day (BID) | ORAL | 0 refills | Status: AC

## 2023-11-26 NOTE — Assessment & Plan Note (Signed)
 1 week into viral uri  Fluids /rest Disc symptomatic care - see instructions on AVS   Augmentin  bid 7d Prednisone  30 mg taper for this and bronchitis   Update if not starting to improve in a week or if worsening (would need in person eval)  Call back and Er precautions noted in detail today

## 2023-11-26 NOTE — Progress Notes (Signed)
 Virtual Visit via Video Note  I connected with Darryl Jenkins on 11/26/23 at  3:00 PM EDT by a video enabled telemedicine application and verified that I am speaking with the correct person using two identifiers.  Patient Location: Other:  work Corporate investment banker: Office/Clinic  I discussed the limitations, risks, security, and privacy concerns of performing an evaluation and management service by video and the availability of in person appointments. I also discussed with the patient that there may be a patient responsible charge related to this service. The patient expressed understanding and agreed to proceed.  Parties involved in encounter  Patient: Darryl Jenkins   Provider:  Laine Balls MD   Subjective: PCP: Balls Laine LABOR, MD  Chief Complaint  Patient presents with   Cough    X1 week    Nasal Congestion    X1 week: no covid test done   HPI Pt presents for c/o cough  About a week ago- tickle on throat  Was manageble /did not feel bad  Cough worsened over the weekend  Cough -sounds barky  No shortness of breath  No wheeze  Day and night -worse at night (3 hours of sleep) No fever or chills at work  Teeth/face hurt - worse on the right side  When he leans forward- nose drips yellow  No ST No ear pain  Hoarse voice     Over the counter  Mucinex  DM  Home remedies  Claritin  Used tessalone pearles last night - eased it a bit    ROS: Per HPI Review of Systems  Constitutional:  Positive for malaise/fatigue. Negative for chills and fever.  HENT:  Positive for congestion and sinus pain. Negative for ear pain and sore throat.   Eyes:  Negative for blurred vision, discharge and redness.  Respiratory:  Positive for cough. Negative for sputum production, shortness of breath, wheezing and stridor.   Cardiovascular:  Negative for chest pain, palpitations and leg swelling.  Gastrointestinal:  Negative for abdominal pain, diarrhea, nausea and vomiting.   Musculoskeletal:  Negative for myalgias.  Skin:  Negative for rash.  Neurological:  Negative for dizziness and headaches.     Current Outpatient Medications:    amLODipine  (NORVASC ) 5 MG tablet, Take 1 tablet (5 mg total) by mouth daily., Disp: 90 tablet, Rfl: 3   fluticasone  (FLONASE ) 50 MCG/ACT nasal spray, SPRAY 2 SPRAYS INTO EACH NOSTRIL EVERY DAY, Disp: 48 mL, Rfl: 3   Multiple Vitamin (MULTIVITAMIN) capsule, Take 1 capsule by mouth 2 (two) times daily., Disp: , Rfl:    Omega-3 Fatty Acids (FISH OIL) 1000 MG CAPS, Take 1 capsule by mouth daily., Disp: , Rfl:    vitamin C (ASCORBIC ACID) 500 MG tablet, Take 500 mg by mouth daily., Disp: , Rfl:   Observations/Objective: Today's Vitals   11/26/23 1455  Temp: 98 F (36.7 C)  Weight: 213 lb (96.6 kg)   Physical Exam Patient appears well, in no distress Weight is baseline  No facial swelling or asymmetry, pain noted on self palpation of right maxillary sinus area  Mildly hoarse voice  No obvious tremor or mobility impairment Moving neck and UEs normally Able to hear the call well  Barky cough noted, no shortness of breath with speech  Talkative and mentally sharp with no cognitive changes No skin changes on face or neck , no rash or pallor Affect is normal   Assessment and Plan: There are no diagnoses linked to this encounter. Problem List Items  Addressed This Visit       Respiratory   Acute sinusitis   1 week into viral uri  Fluids /rest Disc symptomatic care - see instructions on AVS   Augmentin  bid 7d Prednisone  30 mg taper for this and bronchitis   Update if not starting to improve in a week or if worsening (would need in person eval)  Call back and Er precautions noted in detail today        Relevant Medications   amoxicillin -clavulanate (AUGMENTIN ) 875-125 MG tablet   predniSONE  (DELTASONE ) 10 MG tablet   benzonatate  (TESSALON ) 200 MG capsule   Acute bronchitis - Primary   1 week into viral uri  Barky  cough/not productive and some hoarse voice  Prednisone  sent to pharmacy 30 mg taper for congestion/cough /airway inflammation  Tessalon  for cough  Mucinex  dm for cough/congestion otd   Also prednisone  for bacterial sinusitis        Follow Up Instructions: No follow-ups on file.  Drink fluids and rest  mucinex  DM is good for cough and congestion  Nasal saline for congestion as needed  Tylenol for fever or pain or headache  Try the tessalon  pearles for cough  Prednisone  as directed for cough/airway inflammation/ congestion Augmentin  for sinus infection  Please alert us  if symptoms worsen (if severe or short of breath please go to the ER)   Update if not starting to improve in a week or if worsening    I discussed the assessment and treatment plan with the patient. The patient was provided an opportunity to ask questions, and all were answered. The patient agreed with the plan and demonstrated an understanding of the instructions.   The patient was advised to call back or seek an in-person evaluation if the symptoms worsen or if the condition fails to improve as anticipated.  The above assessment and management plan was discussed with the patient. The patient verbalized understanding of and has agreed to the management plan.   Laine Balls, MD

## 2023-11-26 NOTE — Assessment & Plan Note (Signed)
 1 week into viral uri  Barky cough/not productive and some hoarse voice  Prednisone  sent to pharmacy 30 mg taper for congestion/cough /airway inflammation  Tessalon  for cough  Mucinex  dm for cough/congestion otd   Also prednisone  for bacterial sinusitis

## 2023-11-26 NOTE — Patient Instructions (Signed)
 Drink fluids and rest  mucinex  DM is good for cough and congestion  Nasal saline for congestion as needed  Tylenol for fever or pain or headache  Try the tessalon  pearles for cough  Prednisone  as directed for cough/airway inflammation/ congestion Augmentin  for sinus infection  Please alert us  if symptoms worsen (if severe or short of breath please go to the ER)   Update if not starting to improve in a week or if worsening

## 2024-10-11 ENCOUNTER — Other Ambulatory Visit

## 2024-10-18 ENCOUNTER — Encounter: Admitting: Family Medicine
